# Patient Record
Sex: Female | Born: 1995 | Race: Black or African American | Hispanic: No | Marital: Single | Smoking: Former smoker
Health system: Southern US, Community
[De-identification: ages and names within clinical notes are randomized; demographics above are authoritative.]

## PROBLEM LIST (undated history)

## (undated) DIAGNOSIS — J45909 Unspecified asthma, uncomplicated: Secondary | ICD-10-CM

## (undated) DIAGNOSIS — L309 Dermatitis, unspecified: Secondary | ICD-10-CM

## (undated) HISTORY — PX: NO PAST SURGERIES: SHX2092

---
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
## 2016-09-06 ENCOUNTER — Encounter (HOSPITAL_COMMUNITY): Payer: Self-pay

## 2016-09-06 ENCOUNTER — Emergency Department (HOSPITAL_COMMUNITY)
Admission: EM | Admit: 2016-09-06 | Discharge: 2016-09-06 | Disposition: A | Discharge status: Home / Self Care | Payer: Medicaid Other | Attending: Emergency Medicine | Admitting: Emergency Medicine

## 2016-09-06 DIAGNOSIS — R300 Dysuria: Secondary | ICD-10-CM | POA: Insufficient documentation

## 2016-09-06 DIAGNOSIS — Z5321 Procedure and treatment not carried out due to patient leaving prior to being seen by health care provider: Secondary | ICD-10-CM | POA: Insufficient documentation

## 2016-09-06 NOTE — ED Notes (Signed)
Called Pt for urine sample in lobby, no response.
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
## 2016-10-13 ENCOUNTER — Ambulatory Visit (HOSPITAL_COMMUNITY)
Admission: EM | Admit: 2016-10-13 | Discharge: 2016-10-13 | Disposition: A | Discharge status: Home / Self Care | Payer: Medicaid Other | Attending: Family Medicine | Admitting: Family Medicine

## 2016-10-13 ENCOUNTER — Encounter (HOSPITAL_COMMUNITY): Payer: Self-pay | Admitting: Family Medicine

## 2016-10-13 DIAGNOSIS — N3000 Acute cystitis without hematuria: Secondary | ICD-10-CM | POA: Diagnosis not present

## 2016-10-13 DIAGNOSIS — R300 Dysuria: Secondary | ICD-10-CM | POA: Insufficient documentation

## 2016-10-13 DIAGNOSIS — Z113 Encounter for screening for infections with a predominantly sexual mode of transmission: Secondary | ICD-10-CM

## 2016-10-13 DIAGNOSIS — J45909 Unspecified asthma, uncomplicated: Secondary | ICD-10-CM

## 2016-10-13 DIAGNOSIS — Z3202 Encounter for pregnancy test, result negative: Secondary | ICD-10-CM | POA: Insufficient documentation

## 2016-10-13 LAB — POCT URINALYSIS DIP (DEVICE)
Bilirubin Urine: NEGATIVE
Glucose, UA: NEGATIVE mg/dL
HGB URINE DIPSTICK: NEGATIVE
KETONES UR: NEGATIVE mg/dL
Nitrite: POSITIVE — AB
PROTEIN: NEGATIVE mg/dL
SPECIFIC GRAVITY, URINE: 1.025 (ref 1.005–1.030)
Urobilinogen, UA: 1 mg/dL (ref 0.0–1.0)
pH: 7 (ref 5.0–8.0)

## 2016-10-13 LAB — POCT PREGNANCY, URINE: PREG TEST UR: NEGATIVE

## 2016-10-13 MED ORDER — MONTELUKAST SODIUM 10 MG PO TABS: 10.0000 mg | ORAL_TABLET | Freq: Every day | ORAL | 2 refills | Status: DC

## 2016-10-13 MED ORDER — PHENAZOPYRIDINE HCL 200 MG PO TABS: 200.0000 mg | ORAL_TABLET | Freq: Three times a day (TID) | ORAL | 0 refills | Status: DC | PRN

## 2016-10-13 MED ORDER — ALBUTEROL SULFATE HFA 108 (90 BASE) MCG/ACT IN AERS: 1.0000 | INHALATION_SPRAY | Freq: Four times a day (QID) | RESPIRATORY_TRACT | 0 refills | Status: DC | PRN

## 2016-10-13 MED ORDER — SULFAMETHOXAZOLE-TRIMETHOPRIM 800-160 MG PO TABS: 1.0000 | ORAL_TABLET | Freq: Two times a day (BID) | ORAL | 0 refills | Status: AC

## 2016-10-13 NOTE — ED Triage Notes (Signed)
Pt here for foul smelling urine and also sts that her breathing has been bad. sts asthma and her allergies are acting up.

## 2016-10-13 NOTE — ED Provider Notes (Signed)
CSN: [id number]     Arrival date & time 10/13/16  1848 History   First MD Initiated Contact with Patient 10/13/16 1945     Chief Complaint  Patient presents with  . Asthma  . Dysuria   (Consider location/radiation/quality/duration/timing/severity/associated sxs/prior Treatment) 21 year old presents to clinic with dysuria, cough, wheezing, and rhinorrhea. Has past history of seasonal allergies    Asthma  This is a recurrent problem. The current episode started more than 2 days ago. The problem occurs daily. The problem has been gradually worsening. Associated symptoms include shortness of breath. Pertinent negatives include no chest pain, no abdominal pain and no headaches. The symptoms are aggravated by walking and exertion. The symptoms are relieved by medications. The treatment provided mild relief.  Dysuria  Pain quality:  Aching and burning Pain severity:  Moderate Onset quality:  Gradual Duration:  1 day Timing:  Constant Progression:  Worsening Chronicity:  New Recent urinary tract infections: no   Relieved by:  None tried Ineffective treatments:  None tried Urinary symptoms: discolored urine, foul-smelling urine and frequent urination   Urinary symptoms: no hematuria, no hesitancy and no bladder incontinence   Associated symptoms: no abdominal pain, no nausea, no vaginal discharge and no vomiting   Risk factors: not pregnant     Past Medical History:  Diagnosis Date  . Asthma    History reviewed. No pertinent surgical history. History reviewed. No pertinent family history. Social History  Substance Use Topics  . Smoking status: Current Every Day Smoker  . Smokeless tobacco: Never Used  . Alcohol use Yes     Comment: social   OB History    No data available     Review of Systems  Constitutional: Negative.   HENT: Positive for congestion, rhinorrhea and sneezing. Negative for sinus pain and sinus pressure.   Eyes: Positive for itching. Negative for redness.   Respiratory: Positive for cough and shortness of breath. Negative for wheezing.   Cardiovascular: Negative for chest pain and palpitations.  Gastrointestinal: Negative for abdominal pain, diarrhea, nausea and vomiting.  Genitourinary: Positive for dysuria, frequency and urgency. Negative for vaginal bleeding and vaginal discharge.  Musculoskeletal: Negative.   Skin: Negative.   Neurological: Negative for light-headedness and headaches.    Allergies  Penicillins  Home Medications   Prior to Admission medications   Medication Sig Start Date End Date Taking? Authorizing Provider  albuterol (PROVENTIL HFA;VENTOLIN HFA) 108 (90 Base) MCG/ACT inhaler Inhale 1-2 puffs into the lungs every 6 (six) hours as needed for wheezing or shortness of breath. 10/13/16   Dorena Bodo, NP  montelukast (SINGULAIR) 10 MG tablet Take 1 tablet (10 mg total) by mouth at bedtime. 10/13/16   Dorena Bodo, NP  phenazopyridine (PYRIDIUM) 200 MG tablet Take 1 tablet (200 mg total) by mouth 3 (three) times daily as needed for pain. 10/13/16   Dorena Bodo, NP  sulfamethoxazole-trimethoprim (BACTRIM DS,SEPTRA DS) 800-160 MG tablet Take 1 tablet by mouth 2 (two) times daily. 10/13/16 10/20/16  Dorena Bodo, NP   Meds Ordered and Administered this Visit  Medications - No data to display  BP 125/83 (BP Location: Right Arm)   Pulse 84   Temp 98.1 F (36.7 C) (Oral)   Resp 16   LMP 09/25/2016   SpO2 100%  No data found.   Physical Exam  Constitutional: She is oriented to person, place, and time. She appears well-developed and well-nourished. No distress.  HENT:  Head: Normocephalic and atraumatic.  Right Ear: External ear normal.  Left Ear: External ear normal.  Eyes: Conjunctivae are normal. Right eye exhibits no discharge. Left eye exhibits no discharge.  Neck: Normal range of motion.  Cardiovascular: Normal rate and regular rhythm.   Pulmonary/Chest: Effort normal and breath sounds normal.   Abdominal: Soft. Bowel sounds are normal. She exhibits no distension. There is no tenderness. There is no guarding, no CVA tenderness and no tenderness at McBurney's point.  Neurological: She is alert and oriented to person, place, and time.  Skin: Skin is warm and dry. Capillary refill takes less than 2 seconds. She is not diaphoretic.  Psychiatric: She has a normal mood and affect. Her behavior is normal.  Nursing note and vitals reviewed.   Urgent Care Course     Procedures (including critical care time)  Labs Review Labs Reviewed  POCT URINALYSIS DIP (DEVICE) - Abnormal; Notable for the following:       Result Value   Nitrite POSITIVE (*)    Leukocytes, UA TRACE (*)    All other components within normal limits  URINE CULTURE  POCT PREGNANCY, URINE  URINE CYTOLOGY ANCILLARY ONLY    Imaging Review No results found.      MDM   1. Acute cystitis without hematuria   2. Routine screening for STI (sexually transmitted infection)   3. Asthma due to environmental allergies     1. For acute cystitis, starting Bactrim due to penicillin allergy with hives, given Pyridium, encourage fluids, urine sent for culture.  2. Patient's request screening for gonorrhea, chlamydia, trichomoniasis, BV, and yeast candidiasis. We'll notify if positive in 3-5 business days.  3. Start over-the-counter Claritin or Zyrtec daily, Singulair nightly, refilled albuterol inhaler. Follow-up with primary care for symptom management.     Dorena Bodo, NP 10/13/16 2032

## 2016-10-13 NOTE — Discharge Instructions (Signed)
For your seasonal allergies, take a Claritin or Zyrtec every day for the rest of the season. Started on Singulair, take one tablet every night at bedtime. And I have refilled her albuterol inhaler, do 1-2 puffs every 4-6 hours as needed.  For UTI, started on Bactrim, take one tablet twice a day for 5 days. Started on Pyridium, take one tablet every 3 hours as needed, this will dye your urine red or orange this is a normal side effect.  Finally, we've sent you urine for testing for STDs and STIs, we will notify if anything is positive in 3-5 business days and given instructions as to follow-up.

## 2016-10-15 LAB — URINE CULTURE: Culture: >=100000 — AB

## 2016-10-22 ENCOUNTER — Telehealth (HOSPITAL_COMMUNITY): Payer: Self-pay | Admitting: Internal Medicine

## 2016-10-22 LAB — URINE CYTOLOGY ANCILLARY ONLY
Chlamydia: POSITIVE — AB
Neisseria Gonorrhea: NEGATIVE
Trichomonas: NEGATIVE

## 2016-10-22 MED ORDER — AZITHROMYCIN 500 MG PO TABS: 1000.0000 mg | ORAL_TABLET | Freq: Once | ORAL | 0 refills | Status: AC

## 2016-10-22 NOTE — Telephone Encounter (Signed)
Clinical staff, please let patient know that test for chlamydia was positive.  Rx zithromax sent to pharmacy of record, [facility] [address].  Sexual partners need to be notified and tested/treated.  Condoms may reduce risk of reinfection.  Recheck for further evaluation if symptoms are not improving.  LM

## 2016-10-26 LAB — URINE CYTOLOGY ANCILLARY ONLY: Candida vaginitis: NEGATIVE

## 2016-12-09 ENCOUNTER — Ambulatory Visit (HOSPITAL_COMMUNITY)
Admission: EM | Admit: 2016-12-09 | Discharge: 2016-12-09 | Disposition: A | Discharge status: Home / Self Care | Payer: Medicaid Other | Attending: Family Medicine | Admitting: Family Medicine

## 2016-12-09 ENCOUNTER — Encounter (HOSPITAL_COMMUNITY): Payer: Self-pay | Admitting: Emergency Medicine

## 2016-12-09 DIAGNOSIS — Z880 Allergy status to penicillin: Secondary | ICD-10-CM | POA: Insufficient documentation

## 2016-12-09 DIAGNOSIS — Z3202 Encounter for pregnancy test, result negative: Secondary | ICD-10-CM

## 2016-12-09 DIAGNOSIS — N898 Other specified noninflammatory disorders of vagina: Secondary | ICD-10-CM | POA: Insufficient documentation

## 2016-12-09 DIAGNOSIS — Z113 Encounter for screening for infections with a predominantly sexual mode of transmission: Secondary | ICD-10-CM | POA: Insufficient documentation

## 2016-12-09 LAB — POCT PREGNANCY, URINE: Preg Test, Ur: NEGATIVE

## 2016-12-09 MED ORDER — METRONIDAZOLE 500 MG PO TABS: 500.0000 mg | ORAL_TABLET | Freq: Two times a day (BID) | ORAL | 0 refills | Status: DC

## 2016-12-09 NOTE — ED Triage Notes (Signed)
[facility] a [facility] and std checks.

## 2016-12-09 NOTE — ED Provider Notes (Signed)
  [facility]   [id number] 12/09/16 Arrival Time: 1328  ASSESSMENT & PLAN:  1. Vaginal discharge     Meds ordered this encounter  Medications  . metroNIDAZOLE (FLAGYL) 500 MG tablet    Sig: Take 1 tablet (500 mg total) by mouth 2 (two) times daily.    Dispense:  14 tablet    Refill:  0   Will treat empirically for BV. Tested positive at last visit but no treatment since she was without symptoms. Cytology pending for GC/Chlamydia/BV/Candida.  Reviewed expectations re: course of current medical issues. Questions answered. Outlined signs and symptoms indicating need for more acute intervention. Follow up here or in the Emergency Department if worsening. Patient verbalized understanding. After Visit Summary given.   SUBJECTIVE:  Amber Lyons is a 21 y.o. female who presents with complaint of vaginal discharge for one day. No specific pelvic or abdominal pain reported. Some lower back discomfort described as a "sore feeling". On/off. Dull in nature. No injury. Vaginal discharge is white. Questions odor. No skin irritation or erythema described. No rash or lesions. One female sexual partner. Last intercourse 1 week ago. Patient's last menstrual period was 11/19/2016 (exact date). No vaginal bleeding. No urinary symptoms. Normal PO intake. No n/v. No self-treatment.  Desires STD check.  ROS: As per HPI.  OBJECTIVE:  Vitals:   12/09/16 1335  BP: 133/77  Pulse: 75  Resp: 18  Temp: 98 F (36.7 C)  TempSrc: Oral  SpO2: 96%     General appearance: alert, cooperative, appears stated age and no distress Back: no CVA tenderness Abdomen: soft, non-tender; bowel sounds normal; no masses or organomegaly; no guarding or rebound tenderness GU: no external vaginal abnormalities; cervix normal; white discharge present; no bleeding Extremities: extremities normal, atraumatic, no cyanosis or edema Skin: warm and dry; no rashes or lesions   Results for orders placed or performed  during the hospital encounter of 12/09/16  Pregnancy, urine POC  Result Value Ref Range   Preg Test, Ur NEGATIVE NEGATIVE    Labs Reviewed  POCT PREGNANCY, URINE  CERVICOVAGINAL ANCILLARY ONLY    No results found.  Allergies  Allergen Reactions  . Penicillins Swelling    PMHx, SurgHx, SocialHx, Medications, and Allergies were reviewed in the Visit Navigator and updated as appropriate.       Mardella LaymanHagler, Jakiya Bookbinder, MD 12/09/16 1606

## 2016-12-10 LAB — CERVICOVAGINAL ANCILLARY ONLY
Bacterial vaginitis: NEGATIVE
Candida vaginitis: POSITIVE — AB
Chlamydia: NEGATIVE
Neisseria Gonorrhea: NEGATIVE
Trichomonas: POSITIVE — AB

## 2016-12-11 ENCOUNTER — Telehealth: Payer: Self-pay | Admitting: Internal Medicine

## 2016-12-11 MED ORDER — FLUCONAZOLE 150 MG PO TABS: 150.0000 mg | ORAL_TABLET | Freq: Once | ORAL | 0 refills | Status: AC

## 2016-12-11 NOTE — Telephone Encounter (Signed)
Clinical staff, please let patient know that test for candida (yeast) was positive.  Rx fluconazole sent to the [facility] [address] [facility] [address].   Test for trichomonas was also positive; rx metronidazole was given at the urgent care visit 6/27. Refrain from sexual intercourse for the next 7d.  Sexual partners need to be notified and tested/treated.  Condoms may reduce risk of reinfection. Recheck for further evaluation if symptoms are not improving.  LM

## 2017-01-01 ENCOUNTER — Ambulatory Visit (HOSPITAL_COMMUNITY)
Admission: EM | Admit: 2017-01-01 | Discharge: 2017-01-01 | Disposition: A | Discharge status: Home / Self Care | Payer: Medicaid Other | Attending: Internal Medicine | Admitting: Internal Medicine

## 2017-01-01 ENCOUNTER — Encounter (HOSPITAL_COMMUNITY): Payer: Self-pay | Admitting: *Deleted

## 2017-01-01 DIAGNOSIS — R0981 Nasal congestion: Secondary | ICD-10-CM | POA: Diagnosis not present

## 2017-01-01 MED ORDER — CETIRIZINE-PSEUDOEPHEDRINE ER 5-120 MG PO TB12: 1.0000 | ORAL_TABLET | Freq: Every day | ORAL | 0 refills | Status: DC

## 2017-01-01 NOTE — Discharge Instructions (Signed)
Your symptoms could be caused by seasonal allergies. Start Zyrtec-D for nasal congestion. Use your albuterol if you have further wheezing episodes. Follow up with PCP for further workup.

## 2017-01-01 NOTE — ED Provider Notes (Signed)
CSN: [id number]     Arrival date & time 01/01/17  1224 History   None    Chief Complaint  Patient presents with  . Wheezing   (Consider location/radiation/quality/duration/timing/severity/associated sxs/prior Treatment) 21 year old female comes in with two-day history of wheezing, chest pressure. She states she woke up this morning, with having a lot of trouble breathing, and chest tenderness. She has a prescription for albuterol, but has not tried it. Wheezing has gone away. She's also had some nasal congestion. Denies shortness of breath. Denies fever, chills, night sweats. Denies ear pain, eye pain, abdominal pain, nausea, vomiting, diarrhea.      Past Medical History:  Diagnosis Date  . Asthma    History reviewed. No pertinent surgical history. History reviewed. No pertinent family history. Social History  Substance Use Topics  . Smoking status: Current Every Day Smoker  . Smokeless tobacco: Never Used  . Alcohol use Yes     Comment: social   OB History    No data available     Review of Systems  Reason unable to perform ROS: History of present illness as above.    Allergies  Penicillins  Home Medications   Prior to Admission medications   Medication Sig Start Date End Date Taking? Authorizing Provider  cetirizine-pseudoephedrine (ZYRTEC-D) 5-120 MG tablet Take 1 tablet by mouth daily. 01/01/17   Cathie HoopsYu, Caitlyne Ingham V, PA-C  metroNIDAZOLE (FLAGYL) 500 MG tablet Take 1 tablet (500 mg total) by mouth 2 (two) times daily. 12/09/16   Mardella LaymanHagler, Brian, MD   Meds Ordered and Administered this Visit  Medications - No data to display  BP 130/72 (BP Location: Right Arm)   Pulse 70   Temp 98.6 F (37 C) (Oral)   Resp 18   LMP 12/17/2016   SpO2 100%  No data found.   Physical Exam  Constitutional: She is oriented to person, place, and time. She appears well-developed and well-nourished. No distress.  HENT:  Head: Normocephalic and atraumatic.  Right Ear: Tympanic membrane,  external ear and ear canal normal. Tympanic membrane is not erythematous and not bulging.  Left Ear: Tympanic membrane, external ear and ear canal normal. Tympanic membrane is not erythematous and not bulging.  Nose: Rhinorrhea present. Right sinus exhibits no maxillary sinus tenderness and no frontal sinus tenderness. Left sinus exhibits no maxillary sinus tenderness and no frontal sinus tenderness.  Mouth/Throat: Uvula is midline, oropharynx is clear and moist and mucous membranes are normal. Tonsils are 1+ on the right. Tonsils are 1+ on the left.  Eyes: Pupils are equal, round, and reactive to light. Conjunctivae are normal.  Neck: Normal range of motion. Neck supple.  Cardiovascular: Normal rate, regular rhythm and normal heart sounds.  Exam reveals no gallop and no friction rub.   No murmur heard. Pulmonary/Chest: Effort normal and breath sounds normal. She has no decreased breath sounds. She has no rhonchi. She has no rales.  Patient with wheezing when breathing from her mouth, no wheezing heard with breathing through her nose.  Lymphadenopathy:    She has no cervical adenopathy.  Neurological: She is alert and oriented to person, place, and time.  Skin: Skin is warm and dry.  Psychiatric: She has a normal mood and affect. Her behavior is normal. Judgment normal.    Urgent Care Course     Procedures (including critical care time)  Labs Review Labs Reviewed - No data to display  Imaging Review No results found.      MDM   1.  Nasal congestion    Discussed with patient symptoms she is experiencing could be due to nasal congestion and postnasal drip. Patient to start Zyrtec-D for nasal congestion. She can use her inhaler as needed for wheezing. Patient with wheezing when she breathes through her mouth, but no wheezing heard when she is breathing nasally. Given O2 sat at 100% at room air, low suspicion for asthma exacerbation at this visit. Discussed with patient importance of  establishing with PCP. Resources given to patient.    Belinda Fisher, PA-C 01/01/17 1323

## 2017-01-01 NOTE — ED Triage Notes (Signed)
Pt  Reports   sev  Days  Of  Tightness  In  Chest          Wheezing   And  Chest  Pain  Which  Is  Worse   When  She  Coughs     Pt     Reports  She  Usually     Has  An inhaler   But   Did  Not  Use     During this  Episode        At this  Time  Pt is   Siting  Upright  Speaking in  Complete  sentances

## 2017-01-05 ENCOUNTER — Encounter (HOSPITAL_COMMUNITY): Payer: Self-pay | Admitting: *Deleted

## 2017-01-05 ENCOUNTER — Emergency Department (HOSPITAL_COMMUNITY): Payer: Self-pay

## 2017-01-05 ENCOUNTER — Emergency Department (HOSPITAL_COMMUNITY)
Admission: EM | Admit: 2017-01-05 | Discharge: 2017-01-05 | Disposition: A | Discharge status: Home / Self Care | Payer: Self-pay | Attending: Emergency Medicine | Admitting: Emergency Medicine

## 2017-01-05 DIAGNOSIS — F17290 Nicotine dependence, other tobacco product, uncomplicated: Secondary | ICD-10-CM | POA: Insufficient documentation

## 2017-01-05 DIAGNOSIS — R0789 Other chest pain: Secondary | ICD-10-CM

## 2017-01-05 DIAGNOSIS — J9801 Acute bronchospasm: Secondary | ICD-10-CM | POA: Insufficient documentation

## 2017-01-05 DIAGNOSIS — Z79899 Other long term (current) drug therapy: Secondary | ICD-10-CM | POA: Insufficient documentation

## 2017-01-05 MED ORDER — PREDNISONE 20 MG PO TABS
60.0000 mg | ORAL_TABLET | Freq: Once | ORAL | Status: AC
  Administered 2017-01-05: 60 mg via ORAL
  Filled 2017-01-05: qty 3

## 2017-01-05 MED ORDER — PREDNISONE 20 MG PO TABS: 60.0000 mg | ORAL_TABLET | Freq: Every day | ORAL | 0 refills | Status: DC

## 2017-01-05 MED ORDER — IPRATROPIUM BROMIDE 0.02 % IN SOLN
0.5000 mg | Freq: Once | RESPIRATORY_TRACT | Status: AC
  Administered 2017-01-05: 0.5 mg via RESPIRATORY_TRACT
  Filled 2017-01-05: qty 2.5

## 2017-01-05 MED ORDER — ALBUTEROL SULFATE (2.5 MG/3ML) 0.083% IN NEBU
5.0000 mg | INHALATION_SOLUTION | Freq: Once | RESPIRATORY_TRACT | Status: AC
  Administered 2017-01-05: 5 mg via RESPIRATORY_TRACT
  Filled 2017-01-05: qty 6

## 2017-01-05 NOTE — ED Provider Notes (Signed)
[facility]-EMERGENCY DEPT Provider Note   CSN: [id number] Arrival date & time: 01/05/17  1120     History   Chief Complaint Chief Complaint  Patient presents with  . Chest Pain    HPI Amber Lyons is a 21 y.o. female.  Pt with hx of asthma who presents with persistent right side chest pain.  Pt seen at UC x 2 days ago where thought likely related to mild asthma exacerbation.  Pt was given albuterol mdi and zyrtec.  Some short lived relief with albuterol.  No fevers,no n/v/d, no sore throat, no rash.     The history is provided by the patient. No language interpreter was used.  Chest Pain   This is a new problem. The current episode started more than 2 days ago. The problem occurs constantly. The problem has not changed since onset.The pain is associated with movement and breathing. The pain is present in the lateral region. The pain is at a severity of 5/10. The pain is mild. The quality of the pain is described as pressure-like and stabbing. The pain does not radiate. Associated symptoms include shortness of breath. Pertinent negatives include no abdominal pain, no back pain, no claudication, no cough, no fever, no headaches, no irregular heartbeat, no leg pain, no syncope and no vomiting. Treatments tried: albuterol. The treatment provided mild relief.    Past Medical History:  Diagnosis Date  . Asthma     There are no active problems to display for this patient.   History reviewed. No pertinent surgical history.  OB History    No data available       Home Medications    Prior to Admission medications   Medication Sig Start Date End Date Taking? Authorizing Provider  cetirizine-pseudoephedrine (ZYRTEC-D) 5-120 MG tablet Take 1 tablet by mouth daily. 01/01/17   Cathie Hoops, Amy V, PA-C  metroNIDAZOLE (FLAGYL) 500 MG tablet Take 1 tablet (500 mg total) by mouth 2 (two) times daily. 12/09/16   Mardella Layman, MD  predniSONE (DELTASONE) 20 MG tablet Take 3 tablets (60 mg total) by mouth  daily. 01/05/17   Niel Hummer, MD    Family History No family history on file.  Social History Social History  Substance Use Topics  . Smoking status: Current Every Day Smoker    Packs/day: 2.00    Types: Cigars  . Smokeless tobacco: Never Used  . Alcohol use Yes     Comment: social     Allergies   Penicillins   Review of Systems Review of Systems  Constitutional: Negative for fever.  Respiratory: Positive for shortness of breath. Negative for cough.   Cardiovascular: Positive for chest pain. Negative for claudication and syncope.  Gastrointestinal: Negative for abdominal pain and vomiting.  Musculoskeletal: Negative for back pain.  Neurological: Negative for headaches.  All other systems reviewed and are negative.    Physical Exam Updated Vital Signs BP 130/80   Pulse 80   Temp 98 F (36.7 C) (Temporal)   Resp 16   Ht 5\' 6"  (1.676 m)   Wt 87.5 kg (193 lb)   LMP 12/13/2016 (Approximate)   SpO2 100%   BMI 31.15 kg/m   Physical Exam  Constitutional: She is oriented to person, place, and time. She appears well-developed and well-nourished.  HENT:  Head: Normocephalic and atraumatic.  Right Ear: External ear normal.  Left Ear: External ear normal.  Mouth/Throat: Oropharynx is clear and moist.  Eyes: Conjunctivae and EOM are normal.  Neck: Normal range  of motion. Neck supple.  Cardiovascular: Normal rate, normal heart sounds and intact distal pulses.   Pulmonary/Chest: Effort normal. She has wheezes. She exhibits no tenderness.  Diffuse expiratory wheeze noted in all lung fields, no retractions.  Prolonged expirations.   Abdominal: Soft. Bowel sounds are normal. There is no tenderness. There is no rebound.  Musculoskeletal: Normal range of motion.  Neurological: She is alert and oriented to person, place, and time.  Skin: Skin is warm.  Nursing note and vitals reviewed.    ED Treatments / Results  Labs (all labs ordered are listed, but only abnormal  results are displayed) Labs Reviewed - No data to display  EKG  EKG Interpretation  Date/Time:  Tuesday January 05 2017 11:27:33 EDT Ventricular Rate:  67 PR Interval:  124 QRS Duration: 82 QT Interval:  378 QTC Calculation: 399 R Axis:   -5 Text Interpretation:  Normal sinus rhythm Normal ECG no stemi, normal qtc, no delta. Confirmed by Tonette LedererKuhner MD, Tenny Crawoss [phone]) on 01/05/2017 11:53:50 AM       Radiology Dg Chest 2 View  Result Date: 01/05/2017 CLINICAL DATA:  Two-day history of mid and right-sided chest pain, shortness of breath and productive cough. Current history of asthma. EXAM: CHEST  2 VIEW COMPARISON:  None. FINDINGS: Cardiomediastinal silhouette unremarkable. Lungs clear. Bronchovascular markings normal. Pulmonary vascularity normal. No pneumothorax. No pleural effusions. Visualized bony thorax intact. IMPRESSION: Normal examination. Electronically Signed   By: Hulan Saashomas  Lawrence M.D.   On: 01/05/2017 11:48    Procedures Procedures (including critical care time)  Medications Ordered in ED Medications  albuterol (PROVENTIL) (2.5 MG/3ML) 0.083% nebulizer solution 5 mg (5 mg Nebulization Given 01/05/17 1218)  ipratropium (ATROVENT) nebulizer solution 0.5 mg (0.5 mg Nebulization Given 01/05/17 1218)  predniSONE (DELTASONE) tablet 60 mg (60 mg Oral Given 01/05/17 1216)  albuterol (PROVENTIL) (2.5 MG/3ML) 0.083% nebulizer solution 5 mg (5 mg Nebulization Given 01/05/17 1316)  ipratropium (ATROVENT) nebulizer solution 0.5 mg (0.5 mg Nebulization Given 01/05/17 1316)     Initial Impression / Assessment and Plan / ED Course  I have reviewed the triage vital signs and the nursing notes.  Pertinent labs & imaging results that were available during my care of the patient were reviewed by me and considered in my medical decision making (see chart for details).     [age] y with persistent righted chest pain, pt with wheezing on exam.  Seems to be related to bronchospasm. Will give albuterol  and atrovent and steroids.  Will obtain cxr to ensure not related to ptx or pneumonia.  Will obtain ekg to eval for any arrhthymias. Will obtain  Troponin and bmp and cbc.  Highly doubt any PE given normal heart rate, normal O2 sats and wheezing on exam.  cxr visualized by me and no ptx, no pneumonia.    ekg with normal sinus, no stemi, normal qtc.  After 1 neb of albuterol and atrovent and steroids,  child with significant improvement.  Faint end expiratory wheeze and no retractions.  Will repeat albuterol and atrovent and re-eval.    After 2 nebs of albuterol and atrovent and steroids,  child with no wheeze and no retractions.  No longer with chest pain.  Will dc home with 4 more days of steroids.  Pt does have enough albuterol at home.  Discussed signs that warrant reevaluation. Will have follow up with pcp in 2-3 days if not improved.   Final Clinical Impressions(s) / ED Diagnoses   Final diagnoses:  Bronchospasm  Chest wall pain    New Prescriptions Discharge Medication List as of 01/05/2017  1:42 PM    START taking these medications   Details  predniSONE (DELTASONE) 20 MG tablet Take 3 tablets (60 mg total) by mouth daily., Starting Tue 01/05/2017, Print         Niel Hummer, MD 01/05/17 [phone]

## 2017-01-05 NOTE — ED Notes (Signed)
Patient transported to X-ray 

## 2017-01-05 NOTE — ED Triage Notes (Signed)
Pt in from productive with clear sputum, pt seen at UC x 2 days ago, pt reports mid & R CP with SOB, pt denies n/v/d, pt A&O x 4

## 2017-05-16 ENCOUNTER — Inpatient Hospital Stay (HOSPITAL_COMMUNITY): Payer: Self-pay

## 2017-05-16 ENCOUNTER — Inpatient Hospital Stay (HOSPITAL_COMMUNITY)
Admission: AD | Admit: 2017-05-16 | Discharge: 2017-05-16 | Disposition: A | Discharge status: Home / Self Care | Payer: Self-pay | Source: Ambulatory Visit | Attending: Emergency Medicine | Admitting: Emergency Medicine

## 2017-05-16 ENCOUNTER — Other Ambulatory Visit: Payer: Self-pay

## 2017-05-16 ENCOUNTER — Encounter (HOSPITAL_COMMUNITY): Payer: Self-pay

## 2017-05-16 DIAGNOSIS — R0789 Other chest pain: Secondary | ICD-10-CM | POA: Insufficient documentation

## 2017-05-16 DIAGNOSIS — J45909 Unspecified asthma, uncomplicated: Secondary | ICD-10-CM | POA: Insufficient documentation

## 2017-05-16 DIAGNOSIS — Z87891 Personal history of nicotine dependence: Secondary | ICD-10-CM | POA: Insufficient documentation

## 2017-05-16 DIAGNOSIS — M5410 Radiculopathy, site unspecified: Secondary | ICD-10-CM | POA: Insufficient documentation

## 2017-05-16 DIAGNOSIS — M79601 Pain in right arm: Secondary | ICD-10-CM | POA: Insufficient documentation

## 2017-05-16 DIAGNOSIS — R079 Chest pain, unspecified: Secondary | ICD-10-CM

## 2017-05-16 LAB — CBC
HCT: 35.4 % — ABNORMAL LOW (ref 36.0–46.0)
HEMOGLOBIN: 11.4 g/dL — AB (ref 12.0–15.0)
MCH: 29.6 pg (ref 26.0–34.0)
MCHC: 32.2 g/dL (ref 30.0–36.0)
MCV: 91.9 fL (ref 78.0–100.0)
Platelets: 281 10*3/uL (ref 150–400)
RBC: 3.85 MIL/uL — ABNORMAL LOW (ref 3.87–5.11)
RDW: 13.2 % (ref 11.5–15.5)
WBC: 6.2 10*3/uL (ref 4.0–10.5)

## 2017-05-16 LAB — HEPATIC FUNCTION PANEL
ALBUMIN: 3.7 g/dL (ref 3.5–5.0)
ALK PHOS: 69 U/L (ref 38–126)
ALT: 8 U/L — AB (ref 14–54)
AST: 16 U/L (ref 15–41)
BILIRUBIN TOTAL: 0.6 mg/dL (ref 0.3–1.2)
Bilirubin, Direct: 0.2 mg/dL (ref 0.1–0.5)
Indirect Bilirubin: 0.4 mg/dL (ref 0.3–0.9)
TOTAL PROTEIN: 7 g/dL (ref 6.5–8.1)

## 2017-05-16 LAB — TROPONIN I
Troponin I: <0.03 ng/mL (ref <0.03)
Troponin I: <0.03 ng/mL (ref <0.03)

## 2017-05-16 LAB — BRAIN NATRIURETIC PEPTIDE: B Natriuretic Peptide: 5.7 pg/mL (ref 0.0–100.0)

## 2017-05-16 LAB — LIPASE, BLOOD: Lipase: 38 U/L (ref 11–51)

## 2017-05-16 LAB — POCT PREGNANCY, URINE: Preg Test, Ur: NEGATIVE

## 2017-05-16 MED ORDER — SODIUM CHLORIDE 0.9% FLUSH: 3.0000 mL | INTRAVENOUS | Status: DC | PRN

## 2017-05-16 MED ORDER — SODIUM CHLORIDE 0.9 % IV SOLN: 250.0000 mL | INTRAVENOUS | Status: DC | PRN

## 2017-05-16 MED ORDER — KETOROLAC TROMETHAMINE 15 MG/ML IJ SOLN
15.0000 mg | Freq: Once | INTRAMUSCULAR | Status: AC
  Administered 2017-05-16: 15 mg via INTRAVENOUS
  Filled 2017-05-16: qty 1

## 2017-05-16 MED ORDER — GI COCKTAIL ~~LOC~~
30.0000 mL | Freq: Once | ORAL | Status: AC
  Administered 2017-05-16: 30 mL via ORAL
  Filled 2017-05-16: qty 30

## 2017-05-16 MED ORDER — RANITIDINE HCL 150 MG PO TABS
150.0000 mg | ORAL_TABLET | Freq: Two times a day (BID) | ORAL | 0 refills | Status: DC
Start: 2017-05-16 — End: 2019-04-06

## 2017-05-16 MED ORDER — SODIUM CHLORIDE 0.9% FLUSH: 3.0000 mL | Freq: Two times a day (BID) | INTRAVENOUS | Status: DC

## 2017-05-16 MED ORDER — METHOCARBAMOL 500 MG PO TABS: 500.0000 mg | ORAL_TABLET | Freq: Three times a day (TID) | ORAL | 0 refills | Status: DC | PRN

## 2017-05-16 NOTE — ED Notes (Signed)
Patient [facility] with [facility] , pt. articulated intermittent right chest " pressure" 5/10 scale , denies SOB , no nausea or diaphoresis .

## 2017-05-16 NOTE — ED Notes (Addendum)
kmat phleb II collected labs.

## 2017-05-16 NOTE — MAU Provider Note (Signed)
Chief Complaint:  Chest Pain   First Provider Initiated Contact with Patient 05/16/17 0254     HPI: Amber Lyons is a 21 y.o. [id number] who presents to maternity admissions reporting Chest pain and "fast heartbeat" which woke her from sleep.  States has sharp pains going down right arm.  Feels like a weight is on her chest.  No difficulty breathing. No wheezing. Does have history of asthma, which prompted a similar ED visit in July. .States they told her "it's impossible for me to have a heart attack because I'm too young". She reports no vaginal bleeding, vaginal itching/burning, urinary symptoms, h/a, dizziness, n/v, or fever/chills.    After evaluation,, patient does say that "when I go to the ER I like to be screened for everything, like STDs and stuff".  Discussed we need to evaluate the chest pain emergently and [facility]. Is not particularly concerned she was exposed to anything, "but I just like to get screened every time".  States her father "just dropped me off" and did not really come here with any GYN complaints.  Is here because of the chest pain.   Chest Pain   This is a new problem. The current episode started yesterday. The onset quality is gradual. The problem occurs constantly. The problem has been unchanged. The pain is present in the substernal region. The pain is moderate. The quality of the pain is described as heavy, pressure and sharp. The pain radiates to the right shoulder. Associated symptoms include palpitations. Pertinent negatives include no abdominal pain, back pain, cough, diaphoresis, dizziness, fever, headaches, irregular heartbeat (but "heart beats hard and fast"), lower extremity edema, malaise/fatigue, nausea, numbness, shortness of breath, sputum production, syncope or vomiting. The pain is aggravated by nothing. She has tried nothing for the symptoms. There are no known risk factors. Past medical history comments: Asthma    Past  Medical History: Past Medical History:  Diagnosis Date  . Asthma     Past obstetric history: OB History  Gravida Para Term Preterm AB Living  0 0 0 0 0 0  SAB TAB Ectopic Multiple Live Births  0 0 0 0 0        Past Surgical History: Past Surgical History:  Procedure Laterality Date  . NO PAST SURGERIES      Family History: History reviewed. No pertinent family history.  Social History: Social History   Tobacco Use  . Smoking status: Former Smoker    Packs/day: 2.00    Types: Cigars  . Smokeless tobacco: Never Used  Substance Use Topics  . Alcohol use: Yes    Comment: social  . Drug use: Yes    Types: Marijuana    Comment: last use marijuana was 05-15-2017 at 7 pm.    Allergies:  Allergies  Allergen Reactions  . Penicillins Swelling    Meds:  Medications Prior to Admission  Medication Sig Dispense Refill Last Dose  . ibuprofen (ADVIL,MOTRIN) 200 MG tablet Take 200 mg by mouth every 6 (six) hours as needed.   05/16/2017 at Unknown time  . cetirizine-pseudoephedrine (ZYRTEC-D) 5-120 MG tablet Take 1 tablet by mouth daily. 30 tablet 0   . metroNIDAZOLE (FLAGYL) 500 MG tablet Take 1 tablet (500 mg total) by mouth 2 (two) times daily. 14 tablet 0   . predniSONE (DELTASONE) 20 MG tablet Take 3 tablets (60 mg total) by mouth daily. 12 tablet 0     I have reviewed patient's  Past Medical Hx, Surgical Hx, Family Hx, Social Hx, medications and allergies.  ROS:  Review of Systems  Constitutional: Negative for diaphoresis, fever and malaise/fatigue.  Respiratory: Negative for cough, sputum production and shortness of breath.   Cardiovascular: Positive for chest pain and palpitations. Negative for syncope.  Gastrointestinal: Negative for abdominal pain, nausea and vomiting.  Musculoskeletal: Negative for back pain.  Neurological: Negative for dizziness, numbness and headaches.   Other systems negative     Physical Exam   Patient Vitals for the past 24 hrs:  BP  Temp Temp src Pulse Resp SpO2 Height Weight  05/16/17 0236 122/74 97.6 F (36.4 C) Oral 80 18 100 % 5\' 6"  (1.676 m) 189 lb (85.7 kg)   Constitutional: Well-developed, well-nourished female in no acute distress.  Cardiovascular: normal rate and rhythm, no ectopy audible, S1 & S2 heard, no murmur Respiratory: normal effort, no distress. Lungs CTAB with no wheezes or crackles GI: Abd soft, non-tender.  Nondistended.  No rebound, No guarding.   MS: Extremities nontender, no edema, normal ROM Neurologic: Alert and oriented x 4.   Grossly nonfocal. GU: Neg CVAT. Skin:  Warm and Dry Psych:  Affect appropriate.  PELVIC EXAM: Deferred   Labs: Drawn and pending, CBC, BNP, Troponin    Imaging:  EKG  MAU Course/MDM: I have ordered labs as follows: Troponin and bnp and cbc.  Imaging ordered: Chest xray will defer here due to [facility] on the way.   Will get EKG to eval for any arrhthmia  Consult Dr Earlene Plateravis who [facility]. .   .  Dr Erma HeritageIsaacs accepts patient for transfer   Assessment: 1. Chest pain   2.   History of asthma with no current wheezing  Plan: [facility] ED for ongoing evaluation   Wynelle BourgeoisMarie Louvina Cleary CNM, MSN Certified Nurse-Midwife 05/16/2017 2:54 AM

## 2017-05-16 NOTE — ED Notes (Signed)
Labs clicked off in error,  Nurse starting IV and will draw labs.

## 2017-05-16 NOTE — MAU Note (Signed)
My stress level is high. Right arm shooting pains since yesterday morning at 10 am. Then 1pm, felt nauseated. Then felt a weight on my chest through the night.  Felt real hot and heart rate was up and right arm started hurting again.  Took half of ibuprofen 30 min ago, the arm pain got better.  The chest weight is not as heavy now as I relax a little bit. I don't think I'm pregnant but I'm not sure.

## 2017-05-16 NOTE — ED Provider Notes (Signed)
[facility] Provider Note   CSN: [id number] Arrival date & time: 05/16/17  0220     History   Chief Complaint Chief Complaint  Patient presents with  . Chest Pain    HPI Amber Lyons is a 21 y.o. female.  HPI   21 yo F with no significant PMHx here with atypical chest pain. Pt reports that she awoke this evening at midnight with a mild right-sided chest like pressure. She describes it as a tightness sensation in her right upper chest that also radiates toward her shoulder. She's had symptoms similar to this in the past but did not seek help. No SOB, diaphoresis. No nausea or vomiting. Pain mildly worsened with movement. No preceding illness. No LE weakness or swelling. No specific alleviating factors but she has not taken anything for this.  Past Medical History:  Diagnosis Date  . Asthma     There are no active problems to display for this patient.   Past Surgical History:  Procedure Laterality Date  . NO PAST SURGERIES      OB History    Gravida Para Term Preterm AB Living   0 0 0 0 0 0   SAB TAB Ectopic Multiple Live Births   0 0 0 0 0       Home Medications    Prior to Admission medications   Medication Sig Start Date End Date Taking? Authorizing Provider  cetirizine-pseudoephedrine (ZYRTEC-D) 5-120 MG tablet Take 1 tablet by mouth daily. Patient not taking: Reported on 05/16/2017 01/01/17   Belinda FisherYu, Amy V, PA-C  methocarbamol (ROBAXIN) 500 MG tablet Take 1 tablet (500 mg total) by mouth every 8 (eight) hours as needed for muscle spasms. 05/16/17   Shaune PollackIsaacs, Nirvana Blanchett, MD  metroNIDAZOLE (FLAGYL) 500 MG tablet Take 1 tablet (500 mg total) by mouth 2 (two) times daily. Patient not taking: Reported on 05/16/2017 12/09/16   Mardella LaymanHagler, Brian, MD  predniSONE (DELTASONE) 20 MG tablet Take 3 tablets (60 mg total) by mouth daily. Patient not taking: Reported on 05/16/2017 01/05/17   Niel HummerKuhner, Ross, MD  ranitidine (ZANTAC) 150 MG tablet Take 1  tablet (150 mg total) by mouth 2 (two) times daily for 5 days. 05/16/17 05/21/17  Shaune PollackIsaacs, Florie Carico, MD    Family History History reviewed. No pertinent family history.  Social History Social History   Tobacco Use  . Smoking status: Former Smoker    Packs/day: 2.00    Types: Cigars  . Smokeless tobacco: Never Used  Substance Use Topics  . Alcohol use: Yes    Comment: social  . Drug use: Yes    Types: Marijuana    Comment: last use marijuana was 05-15-2017 at 7 pm.     Allergies   Penicillins   Review of Systems Review of Systems  Constitutional: Positive for fatigue. Negative for chills and fever.  HENT: Negative for congestion and rhinorrhea.   Eyes: Negative for visual disturbance.  Respiratory: Negative for cough, shortness of breath and wheezing.   Cardiovascular: Positive for chest pain. Negative for leg swelling.  Gastrointestinal: Negative for abdominal pain, diarrhea, nausea and vomiting.  Genitourinary: Negative for dysuria and flank pain.  Musculoskeletal: Negative for neck pain and neck stiffness.  Skin: Negative for rash and wound.  Allergic/Immunologic: Negative for immunocompromised state.  Neurological: Negative for syncope, weakness and headaches.  All other systems reviewed and are negative.    Physical Exam Updated Vital Signs BP 107/79   Pulse 79   Temp  97.6 F (36.4 C) (Oral)   Resp 17   Ht 5\' 6"  (1.676 m)   Wt 85.7 kg (189 lb)   LMP 04/30/2017   SpO2 100%   BMI 30.51 kg/m   Physical Exam  Constitutional: She is oriented to person, place, and time. She appears well-developed and well-nourished. No distress.  HENT:  Head: Normocephalic and atraumatic.  Eyes: Conjunctivae are normal.  Neck: Neck supple.  Positive Spurling's on right. Mild TTP over right SCM and paraspinal musculature.   Cardiovascular: Normal rate, regular rhythm and normal heart sounds. Exam reveals no friction rub.  No murmur heard. Pulmonary/Chest: Effort normal and  breath sounds normal. No respiratory distress. She has no wheezes. She has no rales.  Abdominal: She exhibits no distension.  Musculoskeletal: She exhibits no edema.  Neurological: She is alert and oriented to person, place, and time. She exhibits normal muscle tone.  Skin: Skin is warm. Capillary refill takes less than 2 seconds.  Psychiatric: She has a normal mood and affect.  Nursing note and vitals reviewed.    ED Treatments / Results  Labs (all labs ordered are listed, but only abnormal results are displayed) Labs Reviewed  CBC - Abnormal; Notable for the following components:      Result Value   RBC 3.85 (*)    Hemoglobin 11.4 (*)    HCT 35.4 (*)    All other components within normal limits  HEPATIC FUNCTION PANEL - Abnormal; Notable for the following components:   ALT 8 (*)    All other components within normal limits  TROPONIN I  BRAIN NATRIURETIC PEPTIDE  LIPASE, BLOOD  TROPONIN I  POCT PREGNANCY, URINE    EKG  EKG Interpretation None       Radiology Dg Chest 2 View  Result Date: 05/16/2017 CLINICAL DATA:  Right-sided chest pain since yesterday. EXAM: CHEST  2 VIEW COMPARISON:  01/05/2017 FINDINGS: The cardiomediastinal contours are normal. The lungs are clear. Pulmonary vasculature is normal. No consolidation, pleural effusion, or pneumothorax. No acute osseous abnormalities are seen. IMPRESSION: Unremarkable radiographs of the chest. Electronically Signed   By: Rubye Oaks M.D.   On: 05/16/2017 05:54    Procedures Procedures (including critical care time)  Medications Ordered in ED Medications  sodium chloride flush (NS) 0.9 % injection 3 mL (3 mLs Intravenous Not Given 05/16/17 0452)  sodium chloride flush (NS) 0.9 % injection 3 mL (not administered)  0.9 %  sodium chloride infusion (not administered)  gi cocktail (Maalox,Lidocaine,Donnatal) (30 mLs Oral Given 05/16/17 0537)  ketorolac (TORADOL) 15 MG/ML injection 15 mg (15 mg Intravenous Given  05/16/17 0537)     Initial Impression / Assessment and Plan / ED Course  I have reviewed the triage vital signs and the nursing notes.  Pertinent labs & imaging results that were available during my care of the patient were reviewed by me and considered in my medical decision making (see chart for details).     21 yo F with no significant PMHx here with atypical chest pain. On exam, pt well appearing and in NAD. Suspect this is multifactorial. Regarding her chest pressure, this is associated with eating, worse lying flat, and resolved with GI cocktail. Suspect GERD/gastritis. She has no RUQ TTP or s/s to suggest biliary colic or cholecystitis. Heart score is <3 and delta trop neg with normal EKG - doubt ACS. PERC neg. Regarding her right upper chest pain, suspect this is more so 2/2 cervical radicular pain. She has positive SPurling's  on right, reproducing her sx. No signs of ongoing cord compromise however. She has normal WOB, normal CXR, and is PERC neg - doubt PE or other pulmonary etiology. Will d/c with supportive care. No apparent acute emergent pathology.  Final Clinical Impressions(s) / ED Diagnoses   Final diagnoses:  Atypical chest pain  Radicular pain    ED Discharge Orders        Ordered    methocarbamol (ROBAXIN) 500 MG tablet  Every 8 hours PRN     05/16/17 0646    ranitidine (ZANTAC) 150 MG tablet  2 times daily     05/16/17 [id number]       Shaune PollackIsaacs, Noela Brothers, MD 05/16/17 [phone]

## 2017-08-04 ENCOUNTER — Encounter (HOSPITAL_COMMUNITY): Payer: Self-pay | Admitting: Emergency Medicine

## 2017-08-04 ENCOUNTER — Other Ambulatory Visit: Payer: Self-pay

## 2017-08-04 ENCOUNTER — Ambulatory Visit (HOSPITAL_COMMUNITY)
Admission: EM | Admit: 2017-08-04 | Discharge: 2017-08-04 | Disposition: A | Discharge status: Home / Self Care | Payer: Self-pay | Attending: Family Medicine | Admitting: Family Medicine

## 2017-08-04 DIAGNOSIS — Z880 Allergy status to penicillin: Secondary | ICD-10-CM | POA: Insufficient documentation

## 2017-08-04 DIAGNOSIS — Z87891 Personal history of nicotine dependence: Secondary | ICD-10-CM | POA: Insufficient documentation

## 2017-08-04 DIAGNOSIS — L309 Dermatitis, unspecified: Secondary | ICD-10-CM

## 2017-08-04 DIAGNOSIS — N3000 Acute cystitis without hematuria: Secondary | ICD-10-CM | POA: Insufficient documentation

## 2017-08-04 DIAGNOSIS — Z113 Encounter for screening for infections with a predominantly sexual mode of transmission: Secondary | ICD-10-CM | POA: Insufficient documentation

## 2017-08-04 LAB — POCT URINALYSIS DIP (DEVICE)
BILIRUBIN URINE: NEGATIVE
Glucose, UA: NEGATIVE mg/dL
Hgb urine dipstick: NEGATIVE
KETONES UR: NEGATIVE mg/dL
Nitrite: NEGATIVE
PH: 6.5 (ref 5.0–8.0)
Protein, ur: NEGATIVE mg/dL
Specific Gravity, Urine: 1.025 (ref 1.005–1.030)
Urobilinogen, UA: 0.2 mg/dL (ref 0.0–1.0)

## 2017-08-04 MED ORDER — AZITHROMYCIN 250 MG PO TABS
ORAL_TABLET | ORAL | Status: AC
  Filled 2017-08-04: qty 4

## 2017-08-04 MED ORDER — SULFAMETHOXAZOLE-TRIMETHOPRIM 800-160 MG PO TABS: 1.0000 | ORAL_TABLET | Freq: Two times a day (BID) | ORAL | 0 refills | Status: AC

## 2017-08-04 MED ORDER — PREDNISONE 10 MG (21) PO TBPK: ORAL_TABLET | Freq: Every day | ORAL | 0 refills | Status: DC

## 2017-08-04 MED ORDER — TRIAMCINOLONE 0.1 % CREAM:EUCERIN CREAM 1:1: 1.0000 "application " | TOPICAL_CREAM | Freq: Two times a day (BID) | CUTANEOUS | 1 refills | Status: DC

## 2017-08-04 MED ORDER — AZITHROMYCIN 250 MG PO TABS
1000.0000 mg | ORAL_TABLET | Freq: Once | ORAL | Status: AC
  Administered 2017-08-04: 1000 mg via ORAL

## 2017-08-04 MED ORDER — CEFTRIAXONE SODIUM 250 MG IJ SOLR
250.0000 mg | Freq: Once | INTRAMUSCULAR | Status: AC
  Administered 2017-08-04: 250 mg via INTRAMUSCULAR

## 2017-08-04 MED ORDER — CEFTRIAXONE SODIUM 250 MG IJ SOLR
INTRAMUSCULAR | Status: AC
  Filled 2017-08-04: qty 250

## 2017-08-04 MED ORDER — LIDOCAINE HCL (PF) 1 % IJ SOLN
INTRAMUSCULAR | Status: AC
Start: 2017-08-04 — End: 2017-08-04
  Filled 2017-08-04: qty 2

## 2017-08-04 NOTE — ED Triage Notes (Signed)
Pt c/o eczema flair up, also states she wants to get tested for STDS, denies std symptoms, was told by someome else to get checked.

## 2017-08-04 NOTE — Discharge Instructions (Addendum)

## 2017-08-05 LAB — URINE CYTOLOGY ANCILLARY ONLY
Chlamydia: POSITIVE — AB
Neisseria Gonorrhea: NEGATIVE
Trichomonas: POSITIVE — AB

## 2017-08-05 LAB — HIV ANTIBODY (ROUTINE TESTING W REFLEX): HIV SCREEN 4TH GENERATION: NONREACTIVE

## 2017-08-05 LAB — RPR: RPR Ser Ql: NONREACTIVE

## 2017-08-05 NOTE — ED Provider Notes (Signed)
[facility]   [id number] 08/04/17 Arrival Time: 1110  ASSESSMENT & PLAN:  1. Eczema, unspecified type   2. Screening for STD (sexually transmitted disease)   3. Acute cystitis without hematuria     Meds ordered this encounter  Medications  . cefTRIAXone (ROCEPHIN) injection 250 mg    Order Specific Question:   Antibiotic Indication:    Answer:   STD  . azithromycin (ZITHROMAX) tablet 1,000 mg  . Triamcinolone Acetonide (TRIAMCINOLONE 0.1 % CREAM : EUCERIN) CREA    Sig: Apply 1 application topically 2 (two) times daily. Don't use on face.    Dispense:  1 each    Refill:  1  . predniSONE (STERAPRED UNI-PAK 21 TAB) 10 MG (21) TBPK tablet    Sig: Take by mouth daily. Take as directed.    Dispense:  21 tablet    Refill:  0  . sulfamethoxazole-trimethoprim (BACTRIM DS,SEPTRA DS) 800-160 MG tablet    Sig: Take 1 tablet by mouth 2 (two) times daily for 5 days.    Dispense:  10 tablet    Refill:  0   Pending: Labs Reviewed  URINE CULTURE  HIV ANTIBODY (ROUTINE TESTING)  RPR  URINE CYTOLOGY ANCILLARY ONLY   Will notify of any positive results. Instructed to refrain from sexual activity for at least seven days.  Reviewed expectations re: course of current medical issues. Questions answered. Outlined signs and symptoms indicating need for more acute intervention. Patient verbalized understanding. After Visit Summary given.   SUBJECTIVE:  Amber Lyons is a 22 y.o. female who presents with complaint of dysuria and mild urinary frequency. Onset gradual, a few days ago. No vaginal discharge though she does want screening for STIs. Sexually active with one female partner. Afebrile. No abdominal or pelvic pain. No n/v. No rashes or lesions. None. Normal PO intake. Ambulatory without difficulty.  Also with history of eczema. Out of steroid cream. Requests Rx. Mild exacerbations this winter with moderate itching.  Patient's last menstrual period was 07/26/2017.  ROS: As  per HPI.  OBJECTIVE:  Vitals:   08/04/17 1153  BP: 140/73  Pulse: 68  Resp: 16  Temp: 98.3 F (36.8 C)  SpO2: 100%     General appearance: alert, cooperative, appears stated age and no distress Throat: lips, mucosa, and tongue normal; teeth and gums normal Back: no CVA tenderness Abdomen: soft, non-tender; bowel sounds normal; no masses or organomegaly; no guarding or rebound tenderness GU: declines Skin: warm and dry; few areas of eczema on extremities Psychological:  Alert and cooperative. Normal mood and affect.  Results for orders placed or performed during the hospital encounter of 08/04/17  POCT urinalysis dip (device)  Result Value Ref Range   Glucose, UA NEGATIVE NEGATIVE mg/dL   Bilirubin Urine NEGATIVE NEGATIVE   Ketones, ur NEGATIVE NEGATIVE mg/dL   Specific Gravity, Urine 1.025 1.005 - 1.030   Hgb urine dipstick NEGATIVE NEGATIVE   pH 6.5 5.0 - 8.0   Protein, ur NEGATIVE NEGATIVE mg/dL   Urobilinogen, UA 0.2 0.0 - 1.0 mg/dL   Nitrite NEGATIVE NEGATIVE   Leukocytes, UA SMALL (A) NEGATIVE     Allergies  Allergen Reactions  . Penicillins Swelling    Past Medical History:  Diagnosis Date  . Asthma     Social History   Socioeconomic History  . Marital status: Married    Spouse name: Not on file  . Number of children: Not on file  . Years of education: Not on file  .  Highest education level: Not on file  Social Needs  . Financial resource strain: Not on file  . Food insecurity - worry: Not on file  . Food insecurity - inability: Not on file  . Transportation needs - medical: Not on file  . Transportation needs - non-medical: Not on file  Occupational History  . Not on file  Tobacco Use  . Smoking status: Former Smoker    Packs/day: 2.00    Types: Cigars  . Smokeless tobacco: Never Used  Substance and Sexual Activity  . Alcohol use: Yes    Comment: social  . Drug use: Yes    Types: Marijuana    Comment: last use marijuana was 05-15-2017 at  7 pm.  . Sexual activity: Not on file  Other Topics Concern  . Not on file  Social History Narrative  . Not on file          Mardella Layman, MD 08/05/17 [phone]

## 2017-08-06 ENCOUNTER — Telehealth (HOSPITAL_COMMUNITY): Payer: Self-pay | Admitting: Emergency Medicine

## 2017-08-06 LAB — URINE CULTURE: Culture: >=100000 — AB

## 2017-08-06 MED ORDER — AZITHROMYCIN 250 MG PO TABS: 1000.0000 mg | ORAL_TABLET | Freq: Once | ORAL | 0 refills | Status: AC

## 2017-08-06 NOTE — Telephone Encounter (Signed)
Pt called needing lab results... Reports she saw abn results on MyChart (Gc/Chlam, Trich, Urine Culture) Pt ID'd properly Pt positive for Gc and Chlam... [facility] w/Rocephine 250 mg and 1 g of Azithromycin Reports she vomited 30 minutes after leaving [facility] and getting her azithromycin  Per Dr. Milus GlazierLauenstein... Ok to call in Zithromax 1g #1 dose, no refills Rx sent to [facility] on [address] per her request.  Pt positive for E coli and sensitive to trimethoprim/sulfa [facility] and tolerating well.  Adv pt if sx are not getting better to return or to f/u w/PCP Education on safe sex given Also adv pt to notify partner(s) [facility] Pt verb understanding.

## 2017-08-07 ENCOUNTER — Telehealth (HOSPITAL_COMMUNITY): Payer: Self-pay | Admitting: Internal Medicine

## 2017-08-07 LAB — URINE CYTOLOGY ANCILLARY ONLY: Candida vaginitis: NEGATIVE

## 2017-08-07 MED ORDER — METRONIDAZOLE 500 MG PO TABS: 500.0000 mg | ORAL_TABLET | Freq: Two times a day (BID) | ORAL | 0 refills | Status: DC

## 2017-08-07 NOTE — Telephone Encounter (Signed)
Summary of recent abnormal lab results: Trichomonas, rx for metronidazole sent to the pharmacy of record today 2/23, [facility] [address] [facility] [address]. UTI, treated appropriately with rx trimethoprim/sulfa on 2/20. Chlamydia, [facility] with po zithromax; pt vomited this and repeat dose sent to pharmacy yesterday 2/22. Please refrain from sexual intercourse for 7 days to give the medicine time to work.  Sexual partners need to be notified and tested/treated.  Condoms may reduce risk of reinfection.  Recheck for further evaluation if symptoms are not improving.

## 2017-09-10 IMAGING — CR DG CHEST 2V
2 series · 2 of 2 positions shown · non-contrast
Comparison: None.

CLINICAL DATA: Two-day history of mid and right-sided chest pain,
shortness of breath and productive cough. Current history of asthma.

EXAM:
CHEST  2 VIEW

[chest pa]
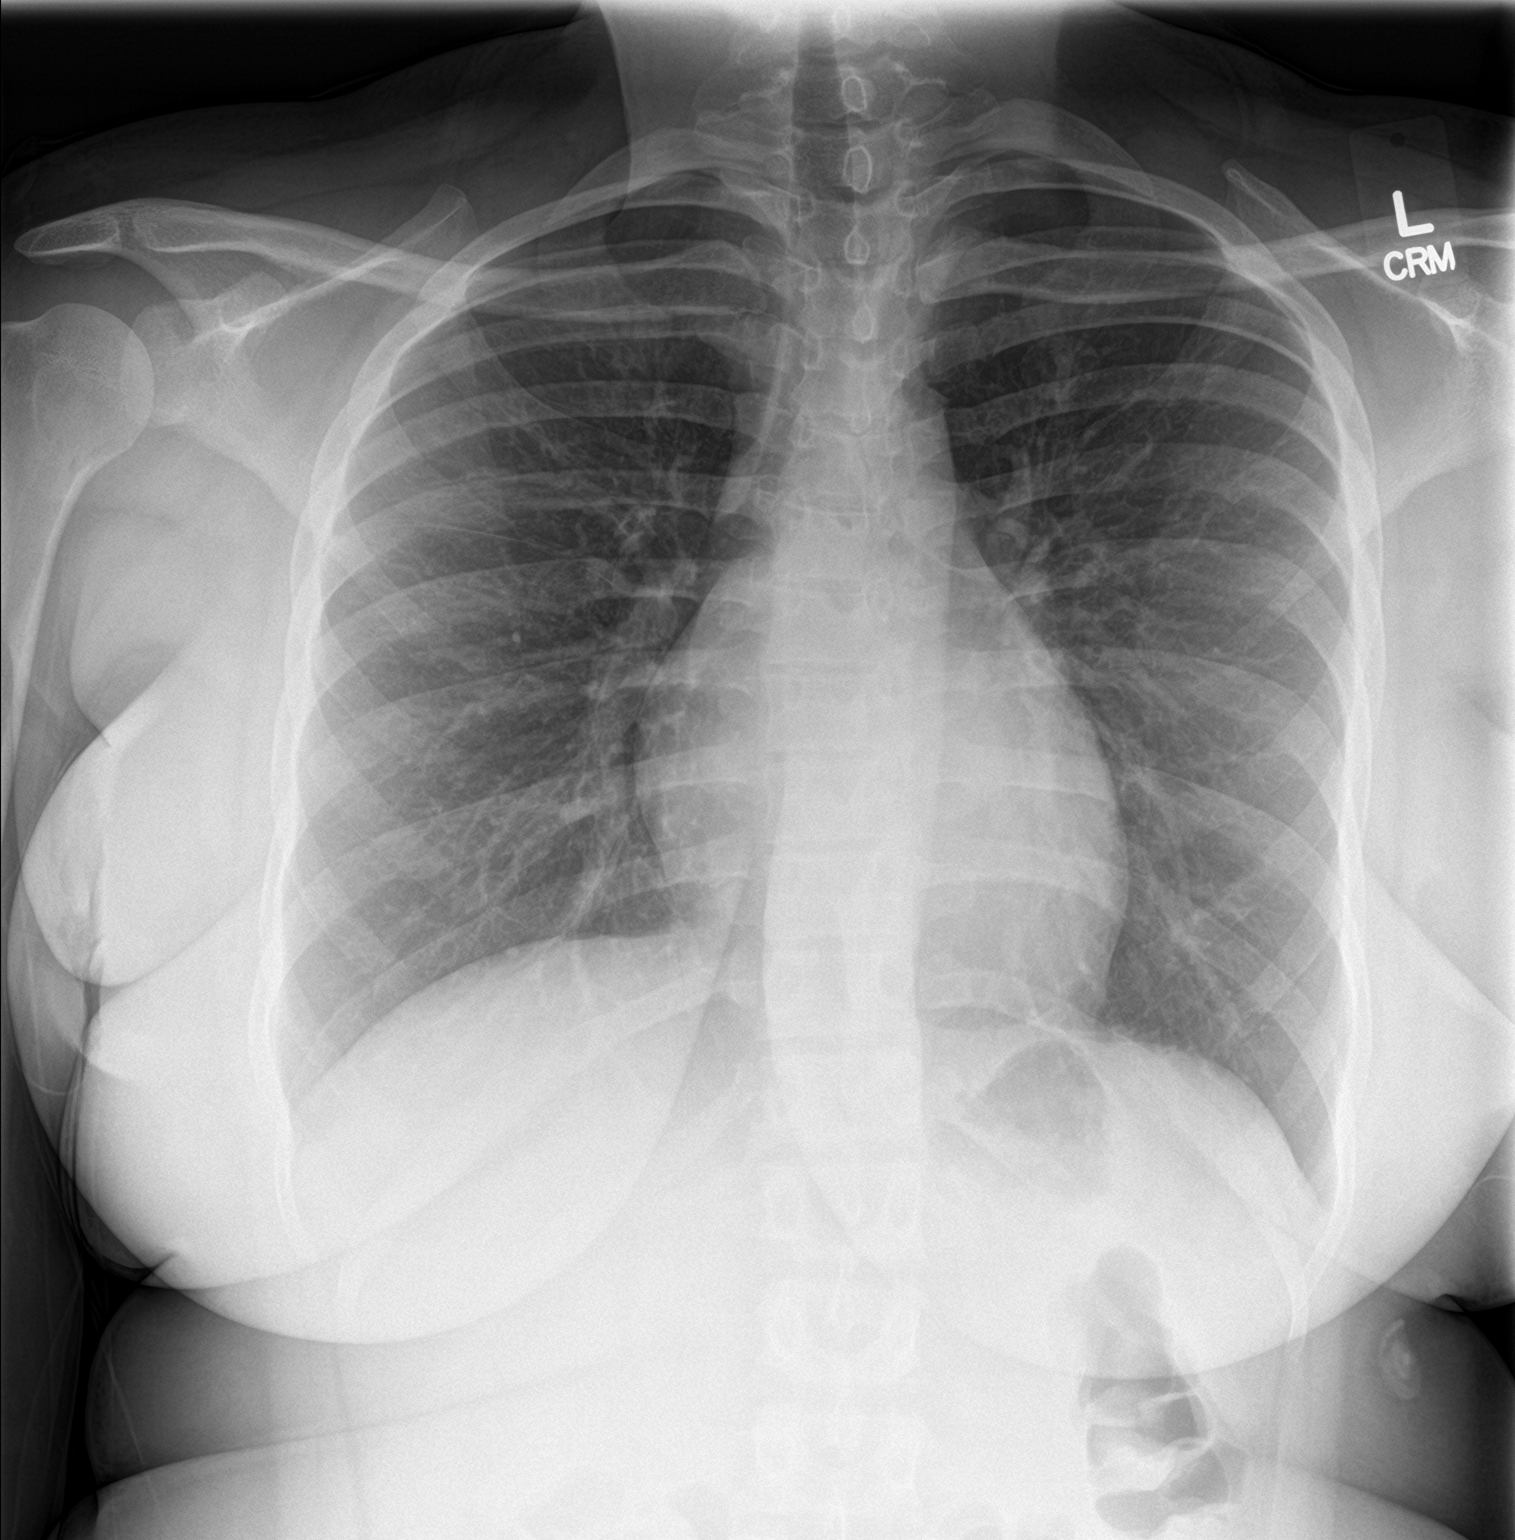

[chest lat]
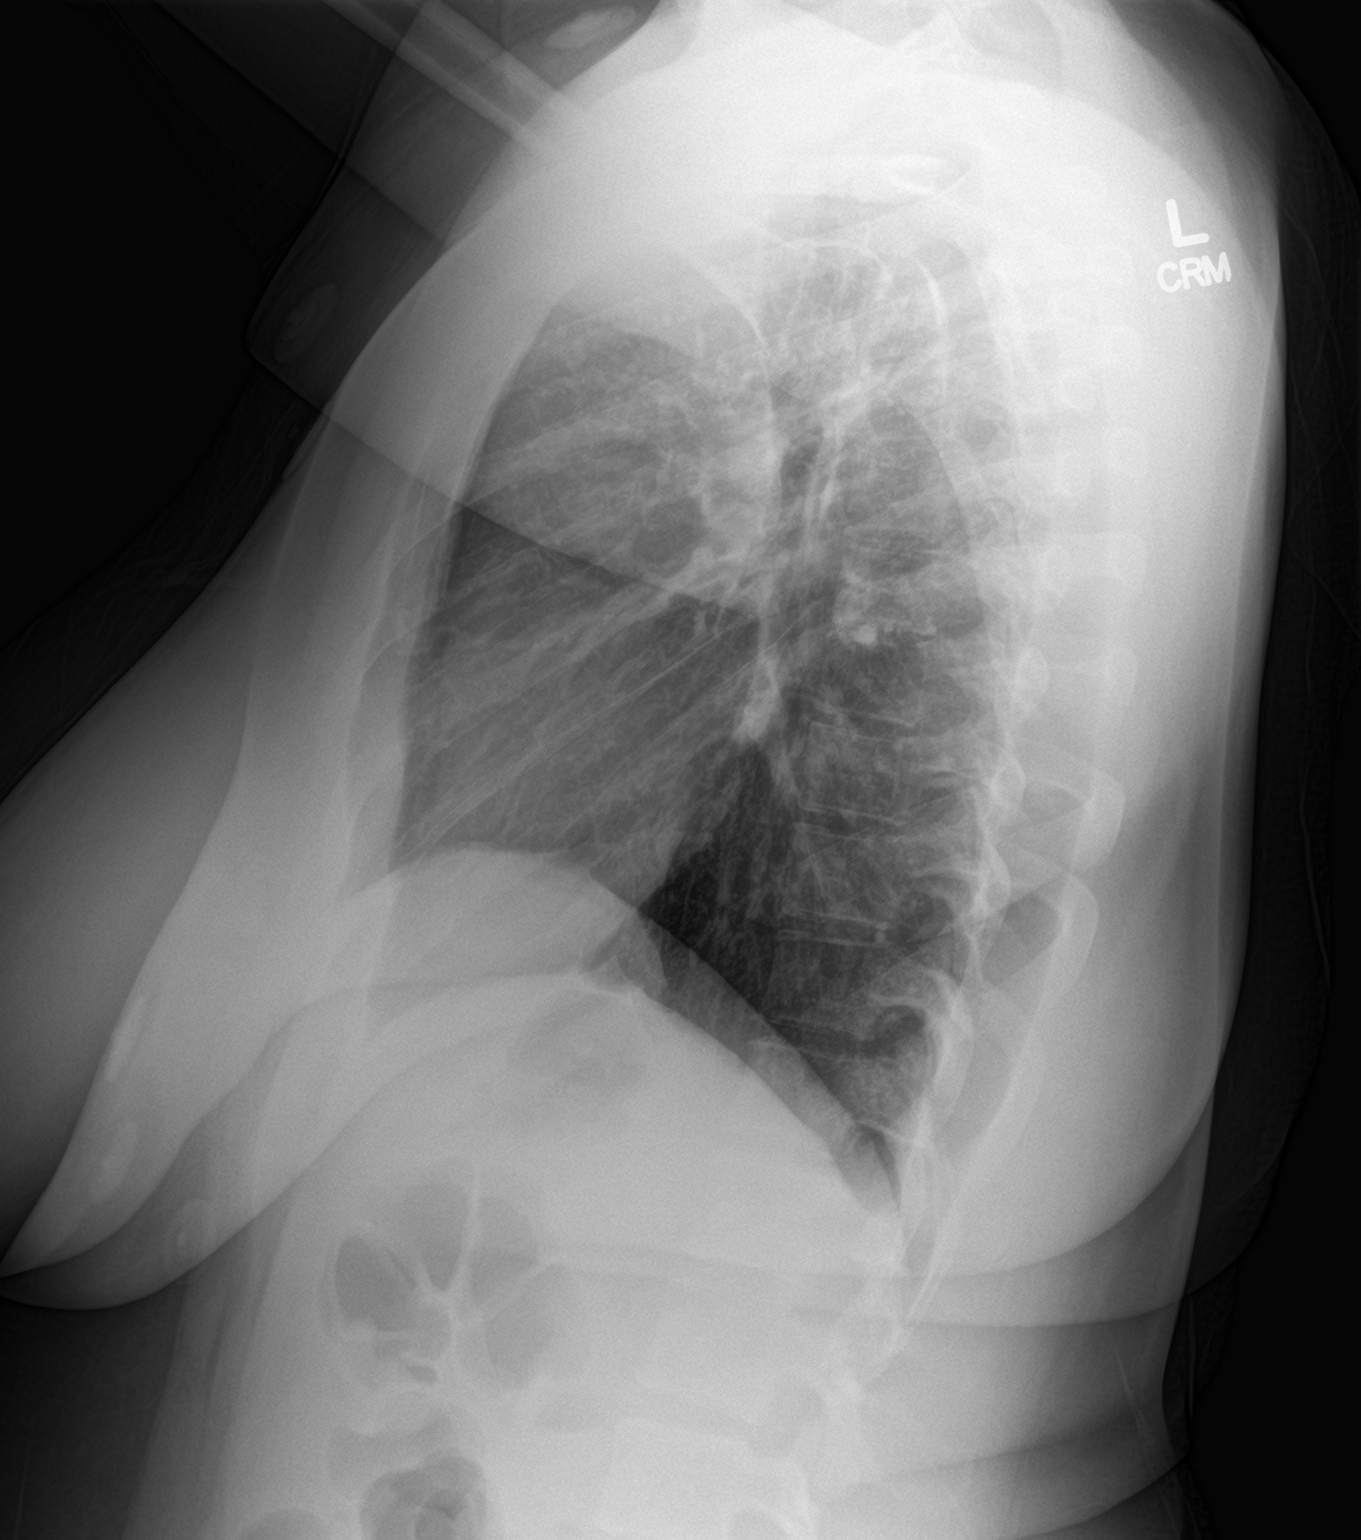

[2 of 2 positions shown; findings below may reference images not displayed]

FINDINGS: Cardiomediastinal silhouette unremarkable. Lungs clear.
Bronchovascular markings normal. Pulmonary vascularity normal. No
pneumothorax. No pleural effusions. Visualized bony thorax intact.
IMPRESSION: Normal examination.

## 2018-03-09 ENCOUNTER — Encounter (HOSPITAL_COMMUNITY): Payer: Self-pay | Admitting: Emergency Medicine

## 2018-03-09 ENCOUNTER — Ambulatory Visit (HOSPITAL_COMMUNITY)
Admission: EM | Admit: 2018-03-09 | Discharge: 2018-03-09 | Disposition: A | Discharge status: Home / Self Care | Payer: Self-pay | Attending: Family Medicine | Admitting: Family Medicine

## 2018-03-09 DIAGNOSIS — L309 Dermatitis, unspecified: Secondary | ICD-10-CM

## 2018-03-09 MED ORDER — TRIAMCINOLONE ACETONIDE 0.1 % EX CREA: 1.0000 "application " | TOPICAL_CREAM | Freq: Two times a day (BID) | CUTANEOUS | 1 refills | Status: DC

## 2018-03-09 MED ORDER — PREDNISONE 10 MG (48) PO TBPK: ORAL_TABLET | ORAL | 0 refills | Status: DC

## 2018-03-09 NOTE — ED Triage Notes (Signed)
Pt states shes had difficulty with her eczema, ran out of her ointment, states they dont give her enough medicine to cover the whole area.

## 2018-03-09 NOTE — ED Provider Notes (Signed)
[facility]   [id number] 03/09/18 Arrival Time: 1417  ASSESSMENT & PLAN:  1. Eczema, unspecified type     Meds ordered this encounter  Medications  . predniSONE (STERAPRED UNI-PAK 48 TAB) 10 MG (48) TBPK tablet    Sig: Take as directed.    Dispense:  48 tablet    Refill:  0  . triamcinolone cream (KENALOG) 0.1 %    Sig: Apply 1 application topically 2 (two) times daily.    Dispense:  453.6 g    Refill:  1   Will follow up with PCP or here if worsening or failing to improve as anticipated. Reviewed expectations re: course of current medical issues. Questions answered. Outlined signs and symptoms indicating need for more acute intervention. Patient verbalized understanding. After Visit Summary given.   SUBJECTIVE:  Amber Lyons is a 22 y.o. female who presents with a skin complaint.   Eczema Reports history of eczema with occasional exacerbations. More frequent exacerbations over the past week. Recent exacerbations and is out of her dermatologic medications. Requests refill. Diffuse over body; seems worse on extremities. Itching. Hot showers dries skin and seems to worsen symptoms. No drainage from areas. No new skin exposures.  ROS: As per HPI.  OBJECTIVE: Vitals:   03/09/18 1527 03/09/18 1529  BP:  120/74  Pulse: 84   Resp: 18   Temp: (!) 97.5 F (36.4 C)   SpO2: 97%     General appearance: alert; no distress Lungs: clear to auscultation bilaterally Heart: regular rate and rhythm Extremities: no edema Skin: warm and dry; signs of infection: no; significant eczema over extremities and torso; some on neck Psychological: alert and cooperative; normal mood and affect  Allergies  Allergen Reactions  . Penicillins Swelling    Past Medical History:  Diagnosis Date  . Asthma    Social History   Socioeconomic History  . Marital status: Married    Spouse name: Not on file  . Number of children: Not on file  . Years of education: Not on file  .  Highest education level: Not on file  Occupational History  . Not on file  Social Needs  . Financial resource strain: Not on file  . Food insecurity:    Worry: Not on file    Inability: Not on file  . Transportation needs:    Medical: Not on file    Non-medical: Not on file  Tobacco Use  . Smoking status: Former Smoker    Packs/day: 2.00    Types: Cigars  . Smokeless tobacco: Never Used  Substance and Sexual Activity  . Alcohol use: Yes    Comment: social  . Drug use: Yes    Types: Marijuana    Comment: last use marijuana was 05-15-2017 at 7 pm.  . Sexual activity: Not on file  Lifestyle  . Physical activity:    Days per week: Not on file    Minutes per session: Not on file  . Stress: Not on file  Relationships  . Social connections:    Talks on phone: Not on file    Gets together: Not on file    Attends religious service: Not on file    Active member of club or organization: Not on file    Attends meetings of clubs or organizations: Not on file    Relationship status: Not on file  . Intimate partner violence:    Fear of current or ex partner: Not on file    Emotionally abused:  Not on file    Physically abused: Not on file    Forced sexual activity: Not on file  Other Topics Concern  . Not on file  Social History Narrative  . Not on file   No family history on file. Past Surgical History:  Procedure Laterality Date  . NO PAST SURGERIES       Mardella Layman, MD 03/17/18 [phone]

## 2019-04-06 ENCOUNTER — Ambulatory Visit (HOSPITAL_COMMUNITY)
Admission: EM | Admit: 2019-04-06 | Discharge: 2019-04-06 | Disposition: A | Discharge status: Home / Self Care | Payer: Self-pay | Attending: Family Medicine | Admitting: Family Medicine

## 2019-04-06 ENCOUNTER — Encounter (HOSPITAL_COMMUNITY): Payer: Self-pay

## 2019-04-06 ENCOUNTER — Other Ambulatory Visit: Payer: Self-pay

## 2019-04-06 DIAGNOSIS — Z113 Encounter for screening for infections with a predominantly sexual mode of transmission: Secondary | ICD-10-CM | POA: Insufficient documentation

## 2019-04-06 DIAGNOSIS — N898 Other specified noninflammatory disorders of vagina: Secondary | ICD-10-CM | POA: Insufficient documentation

## 2019-04-06 DIAGNOSIS — Z3202 Encounter for pregnancy test, result negative: Secondary | ICD-10-CM

## 2019-04-06 LAB — POCT URINALYSIS DIP (DEVICE)
Bilirubin Urine: NEGATIVE
Glucose, UA: NEGATIVE mg/dL
Ketones, ur: NEGATIVE mg/dL
Nitrite: NEGATIVE
Protein, ur: NEGATIVE mg/dL
Specific Gravity, Urine: >=1.03 (ref 1.005–1.030)
Urobilinogen, UA: 0.2 mg/dL (ref 0.0–1.0)
pH: 6 (ref 5.0–8.0)

## 2019-04-06 LAB — RAPID HIV SCREEN (HIV 1/2 AB+AG)
HIV 1/2 Antibodies: NONREACTIVE
HIV-1 P24 Antigen - HIV24: NONREACTIVE

## 2019-04-06 LAB — POCT PREGNANCY, URINE: Preg Test, Ur: NEGATIVE

## 2019-04-06 MED ORDER — TRIAMCINOLONE ACETONIDE 0.1 % EX CREA: 1.0000 "application " | TOPICAL_CREAM | Freq: Two times a day (BID) | CUTANEOUS | 1 refills | Status: DC

## 2019-04-06 NOTE — Discharge Instructions (Signed)
Refilling your triamcinolone cream for eczema flare. We will send urine for culture and swab for culture and call you with any positive results and treat as needed. Follow up as needed for continued or worsening symptoms

## 2019-04-06 NOTE — ED Provider Notes (Signed)
[place]    CSN: [id number] Arrival date & time: 04/06/19  9563      History   Chief Complaint Chief Complaint  Patient presents with  . Eczema  . SEXUALLY TRANSMITTED DISEASE    HPI Amber Lyons is a 23 y.o. female.   Patient is a 23 year old female presents today for eczema flare and wanting STD screening.  Reporting some vaginal swelling and irritation.  This is been present over the past few days.  She has some vaginal discharge after sexual intercourse with a new partner.  History of BV.  Last menstrual period was 25 September.  She has had some mild intermittent abdominal cramping.  No fever, dysuria, hematuria or urinary frequency.  She also would like a refill on triamcinolone cream for eczema flare.  She has currently run out and this works best for her eczema.  ROS per HPI      Past Medical History:  Diagnosis Date  . Asthma     There are no active problems to display for this patient.   Past Surgical History:  Procedure Laterality Date  . NO PAST SURGERIES      OB History    Gravida  0   Para  0   Term  0   Preterm  0   AB  0   Living  0     SAB  0   TAB  0   Ectopic  0   Multiple  0   Live Births  0            Home Medications    Prior to Admission medications   Medication Sig Start Date End Date Taking? Authorizing Provider  triamcinolone cream (KENALOG) 0.1 % Apply 1 application topically 2 (two) times daily. 04/06/19   Loura Halt A, NP  ranitidine (ZANTAC) 150 MG tablet Take 1 tablet (150 mg total) by mouth 2 (two) times daily for 5 days. Patient not taking: Reported on 08/04/2017 05/16/17 04/06/19  Duffy Bruce, MD    Family History Family History  Problem Relation Age of Onset  . Asthma Mother   . Asthma Father     Social History Social History   Tobacco Use  . Smoking status: Former Smoker    Packs/day: 2.00    Types: Cigars  . Smokeless tobacco: Never Used  Substance Use Topics  .  Alcohol use: Yes    Comment: social  . Drug use: Yes    Types: Marijuana    Comment: last use marijuana was 05-15-2017 at 7 pm.     Allergies   Penicillins   Review of Systems Review of Systems   Physical Exam Triage Vital Signs ED Triage Vitals  Enc Vitals Group     BP 04/06/19 0953 117/61     Pulse Rate 04/06/19 0953 68     Resp 04/06/19 0953 16     Temp 04/06/19 0953 98.9 F (37.2 C)     Temp Source 04/06/19 0953 Oral     SpO2 04/06/19 0953 99 %     Weight --      Height --      Head Circumference --      Peak Flow --      Pain Score 04/06/19 0951 0     Pain Loc --      Pain Edu? --      Excl. in [place]? --    No data found.  Updated Vital Signs BP 117/61 (  BP Location: Right Arm)   Pulse 68   Temp 98.9 F (37.2 C) (Oral)   Resp 16   SpO2 99%   Visual Acuity Right Eye Distance:   Left Eye Distance:   Bilateral Distance:    Right Eye Near:   Left Eye Near:    Bilateral Near:     Physical Exam Vitals signs and nursing note reviewed.  Constitutional:      General: She is not in acute distress.    Appearance: Normal appearance. She is not ill-appearing, toxic-appearing or diaphoretic.  HENT:     Head: Normocephalic.     Nose: Nose normal.     Mouth/Throat:     Pharynx: Oropharynx is clear.  Eyes:     Conjunctiva/sclera: Conjunctivae normal.  Neck:     Musculoskeletal: Normal range of motion.  Pulmonary:     Effort: Pulmonary effort is normal.  Abdominal:     Palpations: Abdomen is soft.     Tenderness: There is no abdominal tenderness.  Musculoskeletal: Normal range of motion.  Skin:    General: Skin is warm and dry.     Findings: Rash present.     Comments: Diffuse eczematous patches.   Neurological:     Mental Status: She is alert.  Psychiatric:        Mood and Affect: Mood normal.      UC Treatments / Results  Labs (all labs ordered are listed, but only abnormal results are displayed) Labs Reviewed  POCT URINALYSIS DIP (DEVICE) -  Abnormal; Notable for the following components:      Result Value   Hgb urine dipstick TRACE (*)    Leukocytes,Ua TRACE (*)    All other components within normal limits  URINE CULTURE  RAPID HIV SCREEN (HIV 1/2 AB+AG)  RPR  POC URINE PREG, ED  POCT PREGNANCY, URINE  CERVICOVAGINAL ANCILLARY ONLY    EKG   Radiology No results found.  Procedures Procedures (including critical care time)  Medications Ordered in UC Medications - No data to display  Initial Impression / Assessment and Plan / UC Course  I have reviewed the triage vital signs and the nursing notes.  Pertinent labs & imaging results that were available during my care of the patient were reviewed by me and considered in my medical decision making (see chart for details).     Eczema-refill triamcinolone cream  Concern for STD with vaginal swelling and irritation.  Sending self swab for STD testing and BV yeast. Urine with trace leuks and trace hemoglobin.  Will send for culture. Opting to not treat until we receive culture results. Also testing for BV and syphilis.   Final Clinical Impressions(s) / UC Diagnoses   Final diagnoses:  Vaginal irritation  Screen for STD (sexually transmitted disease)     Discharge Instructions     Refilling your triamcinolone cream for eczema flare. We will send urine for culture and swab for culture and call you with any positive results and treat as needed. Follow up as needed for continued or worsening symptoms     ED Prescriptions    Medication Sig Dispense Auth. Provider   triamcinolone cream (KENALOG) 0.1 % Apply 1 application topically 2 (two) times daily. 453.6 g Dahlia Byes A, NP     PDMP not reviewed this encounter.   Dahlia Byes A, NP 04/06/19 1105

## 2019-04-06 NOTE — ED Triage Notes (Signed)
Patient presents to Urgent Care with complaints of eczema flare up on her wrists and thighs since the past few days. Patient reports she would also like to be tested for STDs since having vaginal discharge after having unprotected intercourse.

## 2019-04-07 LAB — RPR: RPR Ser Ql: NONREACTIVE

## 2019-04-08 LAB — URINE CULTURE: Culture: 40000 — AB

## 2019-04-13 LAB — CERVICOVAGINAL ANCILLARY ONLY
Bacterial vaginitis: POSITIVE — AB
Candida vaginitis: POSITIVE — AB
Chlamydia: POSITIVE — AB
Neisseria Gonorrhea: NEGATIVE
Trichomonas: NEGATIVE

## 2019-04-14 ENCOUNTER — Telehealth (HOSPITAL_COMMUNITY): Payer: Self-pay | Admitting: Emergency Medicine

## 2019-04-14 ENCOUNTER — Encounter (HOSPITAL_COMMUNITY): Payer: Self-pay

## 2019-04-14 MED ORDER — FLUCONAZOLE 150 MG PO TABS: 150.0000 mg | ORAL_TABLET | Freq: Once | ORAL | 0 refills | Status: AC

## 2019-04-14 MED ORDER — AZITHROMYCIN 250 MG PO TABS: 1000.0000 mg | ORAL_TABLET | Freq: Once | ORAL | 0 refills | Status: AC

## 2019-04-14 MED ORDER — METRONIDAZOLE 500 MG PO TABS: 500.0000 mg | ORAL_TABLET | Freq: Two times a day (BID) | ORAL | 0 refills | Status: AC

## 2019-04-14 MED ORDER — TRIAMCINOLONE ACETONIDE 0.1 % EX CREA: 1.0000 "application " | TOPICAL_CREAM | Freq: Two times a day (BID) | CUTANEOUS | 1 refills | Status: DC

## 2019-04-14 NOTE — Telephone Encounter (Signed)
Review mychart messages between me and patient.

## 2019-04-14 NOTE — Telephone Encounter (Signed)
Chlamydia is positive.  Rx po zithromax 1g #1 dose no refills was sent to the pharmacy of record.  Pt needs education to please refrain from sexual intercourse for 7 days to give the medicine time to work, sexual partners need to be notified and tested/treated.  Condoms may reduce risk of reinfection.  Recheck or followup with PCP for further evaluation if symptoms are not improving.   [facility] notified.  Bacterial vaginosis is positive. This was not treated at the urgent care visit.  Flagyl 500 mg BID x 7 days #14 no refills sent to patients pharmacy of choice.    Test for candida (yeast) was positive.  Prescription for fluconazole 150mg  po now, repeat dose in 3d if needed, #2 no refills, sent to the pharmacy of record.  Recheck or followup with PCP for further evaluation if symptoms are not improving.    Attempted to reach patient. No answer at this time. Voicemail left.  Mychart message sent.

## 2019-09-13 ENCOUNTER — Telehealth: Payer: Medicaid Other | Admitting: Family

## 2019-09-13 DIAGNOSIS — L309 Dermatitis, unspecified: Secondary | ICD-10-CM

## 2019-09-13 MED ORDER — TRIAMCINOLONE ACETONIDE 0.1 % EX CREA: 1.0000 "application " | TOPICAL_CREAM | Freq: Two times a day (BID) | CUTANEOUS | 1 refills | Status: DC

## 2019-09-13 NOTE — Progress Notes (Signed)
E Visit for Rash  We are sorry that you are not feeling well. Here is how we plan to help!  It looks like you have eczema. I have sent you a prescription of kenalog cream that you will apply twice a day.   HOME CARE:   Take cool showers and avoid direct sunlight.  Apply cool compress or wet dressings.  Take a bath in an oatmeal bath.  Sprinkle content of one Aveeno packet under running faucet with comfortably warm water.  Bathe for 15-20 minutes, 1-2 times daily.  Pat dry with a towel. Do not rub the rash.  Use hydrocortisone cream.  Take an antihistamine like Benadryl for widespread rashes that itch.  The adult dose of Benadryl is 25-50 mg by mouth 4 times daily.  Caution:  This type of medication may cause sleepiness.  Do not drink alcohol, drive, or operate dangerous machinery while taking antihistamines.  Do not take these medications if you have prostate enlargement.  Read package instructions thoroughly on all medications that you take.  GET HELP RIGHT AWAY IF:   Symptoms don't go away after treatment.  Severe itching that persists.  If you rash spreads or swells.  If you rash begins to smell.  If it blisters and opens or develops a yellow-brown crust.  You develop a fever.  You have a sore throat.  You become short of breath.  MAKE SURE YOU:  Understand these instructions. Will watch your condition. Will get help right away if you are not doing well or get worse.  Thank you for choosing an e-visit. Your e-visit answers were reviewed by a board certified advanced clinical practitioner to complete your personal care plan. Depending upon the condition, your plan could have included both over the counter or prescription medications. Please review your pharmacy choice. Be sure that the pharmacy you have chosen is open so that you can pick up your prescription now.  If there is a problem you may message your provider in MyChart to have the prescription routed to  another pharmacy. Your safety is important to [place]. If you have drug allergies check your prescription carefully.  For the next 24 hours, you can use MyChart to ask questions about today's visit, request a non-urgent call back, or ask for a work or school excuse from your e-visit provider. You will get an email in the next two days asking about your experience. I hope that your e-visit has been valuable and will speed your recovery.   Approximately 5 minutes was spent documenting and reviewing patient's chart.

## 2019-09-14 ENCOUNTER — Inpatient Hospital Stay: Admission: RE | Admit: 2019-09-14 | Payer: Medicaid Other | Source: Ambulatory Visit

## 2019-10-11 ENCOUNTER — Telehealth: Payer: Medicaid Other | Admitting: Family

## 2019-10-11 DIAGNOSIS — L309 Dermatitis, unspecified: Secondary | ICD-10-CM

## 2019-10-11 DIAGNOSIS — L209 Atopic dermatitis, unspecified: Secondary | ICD-10-CM

## 2019-10-11 MED ORDER — TRIAMCINOLONE ACETONIDE 0.1 % EX CREA: 1.0000 "application " | TOPICAL_CREAM | Freq: Two times a day (BID) | CUTANEOUS | 1 refills | Status: DC

## 2019-10-11 MED ORDER — FLUTICASONE PROPIONATE 50 MCG/ACT NA SUSP: 2.0000 | Freq: Every day | NASAL | 6 refills | Status: DC

## 2019-10-11 MED ORDER — CETIRIZINE HCL 10 MG PO TABS: 10.0000 mg | ORAL_TABLET | Freq: Every day | ORAL | 11 refills | Status: DC

## 2019-10-11 NOTE — Progress Notes (Signed)
E Visit for Rash  We are sorry that you are not feeling well. Here is how we plan to help!   It looks like you have a topic dermatitis or eczema. I have refilled your kenalogl cream, you need to start taking a Zyrtec, and use Flonase daily. You also need to follow up with your PCP or dermatologist. You also need to use a thick moisturizer after a shower. Avoid scratching. Avoid any harsh chemicals are irritants.    Approximately 5 minutes was spent documenting and reviewing patient's chart.    HOME CARE:   Take cool showers and avoid direct sunlight.  Apply cool compress or wet dressings.  Take a bath in an oatmeal bath.  Sprinkle content of one Aveeno packet under running faucet with comfortably warm water.  Bathe for 15-20 minutes, 1-2 times daily.  Pat dry with a towel. Do not rub the rash.  Use hydrocortisone cream.  Take an antihistamine like Benadryl for widespread rashes that itch.  The adult dose of Benadryl is 25-50 mg by mouth 4 times daily.  Caution:  This type of medication may cause sleepiness.  Do not drink alcohol, drive, or operate dangerous machinery while taking antihistamines.  Do not take these medications if you have prostate enlargement.  Read package instructions thoroughly on all medications that you take.  GET HELP RIGHT AWAY IF:   Symptoms don't go away after treatment.  Severe itching that persists.  If you rash spreads or swells.  If you rash begins to smell.  If it blisters and opens or develops a yellow-brown crust.  You develop a fever.  You have a sore throat.  You become short of breath.  MAKE SURE YOU:  Understand these instructions. Will watch your condition. Will get help right away if you are not doing well or get worse.  Thank you for choosing an e-visit. Your e-visit answers were reviewed by a board certified advanced clinical practitioner to complete your personal care plan. Depending upon the condition, your plan could have  included both over the counter or prescription medications. Please review your pharmacy choice. Be sure that the pharmacy you have chosen is open so that you can pick up your prescription now.  If there is a problem you may message your provider in MyChart to have the prescription routed to another pharmacy. Your safety is important to [place]. If you have drug allergies check your prescription carefully.  For the next 24 hours, you can use MyChart to ask questions about today's visit, request a non-urgent call back, or ask for a work or school excuse from your e-visit provider. You will get an email in the next two days asking about your experience. I hope that your e-visit has been valuable and will speed your recovery.

## 2019-10-20 ENCOUNTER — Other Ambulatory Visit: Payer: Self-pay | Admitting: Family

## 2019-10-20 NOTE — Progress Notes (Signed)
Approximately 5 minutes was spent documenting and reviewing patient's chart.   

## 2019-10-27 ENCOUNTER — Other Ambulatory Visit: Payer: Self-pay

## 2019-10-27 ENCOUNTER — Encounter: Payer: Self-pay | Admitting: Emergency Medicine

## 2019-10-27 ENCOUNTER — Ambulatory Visit
Admission: EM | Admit: 2019-10-27 | Discharge: 2019-10-27 | Disposition: A | Discharge status: Home / Self Care | Payer: Medicaid Other | Attending: Physician Assistant | Admitting: Physician Assistant

## 2019-10-27 ENCOUNTER — Ambulatory Visit (INDEPENDENT_AMBULATORY_CARE_PROVIDER_SITE_OTHER)
Admission: RE | Admit: 2019-10-27 | Discharge: 2019-10-27 | Disposition: A | Discharge status: Other Acute Inpatient Hospital | Payer: Medicaid Other | Source: Ambulatory Visit

## 2019-10-27 DIAGNOSIS — L089 Local infection of the skin and subcutaneous tissue, unspecified: Secondary | ICD-10-CM

## 2019-10-27 DIAGNOSIS — L723 Sebaceous cyst: Secondary | ICD-10-CM

## 2019-10-27 DIAGNOSIS — H00011 Hordeolum externum right upper eyelid: Secondary | ICD-10-CM

## 2019-10-27 DIAGNOSIS — L0291 Cutaneous abscess, unspecified: Secondary | ICD-10-CM

## 2019-10-27 HISTORY — DX: Dermatitis, unspecified: L30.9

## 2019-10-27 MED ORDER — MUPIROCIN 2 % EX OINT: 1.0000 "application " | TOPICAL_OINTMENT | Freq: Two times a day (BID) | CUTANEOUS | 0 refills | Status: DC

## 2019-10-27 NOTE — Discharge Instructions (Addendum)
Come to the Urgent Care for in-person evaluation.   

## 2019-10-27 NOTE — ED Provider Notes (Signed)
[facility]    CSN: [id number] Arrival date & time: 10/27/19  1615      History   Chief Complaint Chief Complaint  Patient presents with  . Cyst    HPI Amber Lyons is a 24 y.o. female.   24 year old female comes in for cyst to the left side of the head for the past 8 days.  Denies injury/trauma.  States area has gotten larger, and tender to touch.  Denies spreading erythema, warmth, fever.  Has not tried anything for the symptoms.     Past Medical History:  Diagnosis Date  . Asthma   . Eczema     There are no problems to display for this patient.   Past Surgical History:  Procedure Laterality Date  . NO PAST SURGERIES      OB History    Gravida  0   Para  0   Term  0   Preterm  0   AB  0   Living  0     SAB  0   TAB  0   Ectopic  0   Multiple  0   Live Births  0            Home Medications    Prior to Admission medications   Medication Sig Start Date End Date Taking? Authorizing Rilley Poulter  cetirizine (ZYRTEC) 10 MG tablet Take 1 tablet (10 mg total) by mouth daily. 10/11/19   Sharion Balloon, FNP  fluticasone (FLONASE) 50 MCG/ACT nasal spray Place 2 sprays into both nostrils daily. 10/11/19   Sharion Balloon, FNP  mupirocin ointment (BACTROBAN) 2 % Apply 1 application topically 2 (two) times daily. 10/27/19   Tasia Catchings, Amy V, PA-C  triamcinolone cream (KENALOG) 0.1 % Apply 1 application topically 2 (two) times daily. 10/11/19   Sharion Balloon, FNP  ranitidine (ZANTAC) 150 MG tablet Take 1 tablet (150 mg total) by mouth 2 (two) times daily for 5 days. Patient not taking: Reported on 08/04/2017 05/16/17 04/06/19  Duffy Bruce, MD    Family History Family History  Problem Relation Age of Onset  . Asthma Mother   . Asthma Father     Social History Social History   Tobacco Use  . Smoking status: Former Smoker    Packs/day: 2.00    Types: Cigars  . Smokeless tobacco: Never Used  Substance Use Topics  . Alcohol use: Yes     Comment: social  . Drug use: Yes    Types: Marijuana    Comment: last use marijuana was 05-15-2017 at 7 pm.     Allergies   Penicillins   Review of Systems Review of Systems  Reason unable to perform ROS: See HPI as above.     Physical Exam Triage Vital Signs ED Triage Vitals [10/27/19 1627]  Enc Vitals Group     BP 122/71     Pulse Rate 96     Resp 18     Temp 97.6 F (36.4 C)     Temp Source Oral     SpO2 97 %     Weight      Height      Head Circumference      Peak Flow      Pain Score 5     Pain Loc      Pain Edu?      Excl. in [place]?    No data found.  Updated Vital Signs BP 122/71 (BP Location: Left  Arm)   Pulse 96   Temp 97.6 F (36.4 C) (Oral)   Resp 18   SpO2 97%   Physical Exam Constitutional:      General: She is not in acute distress.    Appearance: Normal appearance. She is well-developed. She is not toxic-appearing or diaphoretic.  HENT:     Head: Normocephalic and atraumatic.   Eyes:     Conjunctiva/sclera: Conjunctivae normal.     Pupils: Pupils are equal, round, and reactive to light.  Pulmonary:     Effort: Pulmonary effort is normal. No respiratory distress.     Comments: Speaking in full sentences without difficulty Musculoskeletal:     Cervical back: Normal range of motion and neck supple.  Skin:    General: Skin is warm and dry.  Neurological:     Mental Status: She is alert and oriented to person, place, and time.      UC Treatments / Results  Labs (all labs ordered are listed, but only abnormal results are displayed) Labs Reviewed - No data to display  EKG   Radiology No results found.  Procedures Incision and Drainage  Date/Time: 10/27/2019 7:52 PM Performed by: Belinda Fisher, PA-C Authorized by: Belinda Fisher, PA-C   Consent:    Consent obtained:  Verbal   Consent given by:  Patient   Risks discussed:  Incomplete drainage, infection, pain and bleeding   Alternatives discussed:  Alternative treatment and  referral Location:    Type:  Abscess   Size:  1cm x 1cm    Location:  Head   Head location:  Scalp Pre-procedure details:    Skin preparation:  Chloraprep Anesthesia (see MAR for exact dosages):    Anesthesia method:  Local infiltration   Local anesthetic:  Lidocaine 2% w/o epi Procedure type:    Complexity:  Simple Procedure details:    Incision types:  Single straight   Incision depth:  Dermal   Scalpel blade:  11   Wound management:  Probed and deloculated and irrigated with saline   Drainage:  Purulent   Drainage amount:  Moderate   Wound treatment:  Wound left open   Packing materials:  None Post-procedure details:    Patient tolerance of procedure:  Tolerated well, no immediate complications   (including critical care time)  Medications Ordered in UC Medications - No data to display  Initial Impression / Assessment and Plan / UC Course  I have reviewed the triage vital signs and the nursing notes.  Pertinent labs & imaging results that were available during my care of the patient were reviewed by me and considered in my medical decision making (see chart for details).     Patient tolerated procedure well. No surrounding cellulitis, will monitor for now without abx. Wound care instructions given. Return precautions given. Patient expresses understanding and agrees to plan.   Final Clinical Impressions(s) / UC Diagnoses   Final diagnoses:  Infected sebaceous cyst   ED Prescriptions    Medication Sig Dispense Auth. Tamla Winkels   mupirocin ointment (BACTROBAN) 2 % Apply 1 application topically 2 (two) times daily. 22 g Belinda Fisher, PA-C     PDMP not reviewed this encounter.   Belinda Fisher, PA-C 10/27/19 1954

## 2019-10-27 NOTE — Discharge Instructions (Signed)
Continue to massage to allow continued drainage. Keep wound clean and dry. You can clean gently with soap and water. Do not soak area in water. Monitor for spreading redness, increased warmth, increased swelling, fever, follow up for reevaluation needed.

## 2019-10-27 NOTE — ED Provider Notes (Signed)
Virtual Visit via Video Note:  Amber Lyons  initiated request for Telemedicine visit with [facility] team. I connected with Amber Lyons  on 10/27/2019 at 10:56 AM  for a synchronized telemedicine visit using a video enabled HIPPA compliant telemedicine application. I verified that I am speaking with Amber Lyons  using two identifiers. Mickie Bail, NP  was physically located in a [facility] Urgent care site and TALIAH PORCHE was located at a different location.   The limitations of evaluation and management by telemedicine as well as the availability of in-person appointments were discussed. Patient was informed that she  may incur a bill ( including co-pay) for this virtual visit encounter. Amber Lyons  expressed understanding and gave verbal consent to proceed with virtual visit.     History of Present Illness:Amber Lyons  is a 24 y.o. female presents for evaluation of "knot" on her head and "stye" on her eye.  Tender raised knot on left upper forehead x 1 week; getting larger each day.  No falls or injury.  The stye is on her right upper eyelid x 2 days.  She denies fever or chills.     Allergies  Allergen Reactions  . Penicillins Swelling     Past Medical History:  Diagnosis Date  . Asthma      Social History   Tobacco Use  . Smoking status: Former Smoker    Packs/day: 2.00    Types: Cigars  . Smokeless tobacco: Never Used  Substance Use Topics  . Alcohol use: Yes    Comment: social  . Drug use: Yes    Types: Marijuana    Comment: last use marijuana was 05-15-2017 at 7 pm.    ROS: as stated in HPI.  All other systems reviewed and negative.      Observations/Objective: Physical Exam  VITALS: Patient denies fever. GENERAL: Alert, appears well and in no acute distress. HEENT: Atraumatic.  Right upper eyelid swelling.   NECK: Normal movements of the head and neck. CARDIOPULMONARY: No increased WOB. Speaking in clear sentences. I:E ratio WNL.  MS:  Moves all visible extremities without noticeable abnormality. PSYCH: Pleasant and cooperative, well-groomed. Speech normal rate and rhythm. Affect is appropriate. Insight and judgement are appropriate. Attention is focused, linear, and appropriate.  NEURO: CN grossly intact. Oriented as arrived to appointment on time with no prompting. Moves both UE equally.  SKIN: 2-3 cm raised lesion on left upper forehead at hairline that appears to be an abscess.     Assessment and Plan:    ICD-10-CM   1. Abscess  L02.91   2. Hordeolum externum of right upper eyelid  H00.011        Follow Up Instructions: Discussed treatment options with patient.  She opts to come to the urgent care for in person evaluation.    I discussed the assessment and treatment plan with the patient. The patient was provided an opportunity to ask questions and all were answered. The patient agreed with the plan and demonstrated an understanding of the instructions.   The patient was advised to call back or seek an in-person evaluation if the symptoms worsen or if the condition fails to improve as anticipated.      Mickie Bail, NP  10/27/2019 10:56 AM         Mickie Bail, NP 10/27/19 1056

## 2019-10-27 NOTE — ED Triage Notes (Signed)
Pt here for cyst to left side of head x 8 days and also stye on her right eye; pt denies drainage or injury

## 2019-11-20 ENCOUNTER — Encounter: Payer: Self-pay | Admitting: Emergency Medicine

## 2019-11-20 ENCOUNTER — Ambulatory Visit
Admission: EM | Admit: 2019-11-20 | Discharge: 2019-11-20 | Disposition: A | Discharge status: Home / Self Care | Payer: Medicaid Other | Attending: Physician Assistant | Admitting: Physician Assistant

## 2019-11-20 ENCOUNTER — Other Ambulatory Visit: Payer: Self-pay

## 2019-11-20 DIAGNOSIS — L309 Dermatitis, unspecified: Secondary | ICD-10-CM

## 2019-11-20 MED ORDER — DESONIDE 0.05 % EX CREA: TOPICAL_CREAM | Freq: Two times a day (BID) | CUTANEOUS | 0 refills | Status: DC

## 2019-11-20 MED ORDER — TRIAMCINOLONE ACETONIDE 0.1 % EX CREA: 1.0000 "application " | TOPICAL_CREAM | Freq: Two times a day (BID) | CUTANEOUS | 0 refills | Status: DC

## 2019-11-20 MED ORDER — TRIAMCINOLONE ACETONIDE 0.025 % EX OINT: 1.0000 "application " | TOPICAL_OINTMENT | Freq: Two times a day (BID) | CUTANEOUS | 0 refills | Status: DC

## 2019-11-20 NOTE — ED Triage Notes (Signed)
Patient has a history of eczema.  Patient is out of medications.  Patient requesting refill of medications

## 2019-11-20 NOTE — Discharge Instructions (Signed)
Continue triamcinolone cream as needed. Add triamcinolone ointment at night to more severe areas. Deosnide sparingly to the face. Allergy medicine such as zyrtec daily. Follow up with PCP/dermatology for further evaluation. If showing signs of infection including spreading redness, warmth, pain, follow up for reevaluation.

## 2019-11-20 NOTE — ED Provider Notes (Signed)
[facility]    CSN: [id number] Arrival date & time: 11/20/19  1402      History   Chief Complaint Chief Complaint  Patient presents with  . Eczema    HPI Amber Lyons is a 24 y.o. female.   24 year old female comes in for eczema flare up. States running out of triamcinolone and would like refill. She gets flare ups typically when season changes. Rashes throughout the body including chest, face, extensor extremities. Denies spreading erythema, warmth, fever, pain. Used to see dermatology for this but stopped due to lack of insurance.      Past Medical History:  Diagnosis Date  . Asthma   . Eczema     There are no problems to display for this patient.   Past Surgical History:  Procedure Laterality Date  . NO PAST SURGERIES      OB History    Gravida  0   Para  0   Term  0   Preterm  0   AB  0   Living  0     SAB  0   TAB  0   Ectopic  0   Multiple  0   Live Births  0            Home Medications    Prior to Admission medications   Medication Sig Start Date End Date Taking? Authorizing Provider  desonide (DESOWEN) 0.05 % cream Apply topically 2 (two) times daily. Sparingly on the face if needed. Do not use more than 7 days in a row. 11/20/19   Cathie Hoops, Coral Soler V, PA-C  triamcinolone (KENALOG) 0.025 % ointment Apply 1 application topically 2 (two) times daily. 11/20/19   Cathie Hoops, Christophr Calix V, PA-C  triamcinolone cream (KENALOG) 0.1 % Apply 1 application topically 2 (two) times daily. 11/20/19   Cathie Hoops, Taimur Fier V, PA-C  cetirizine (ZYRTEC) 10 MG tablet Take 1 tablet (10 mg total) by mouth daily. 10/11/19 11/20/19  Junie Spencer, FNP  fluticasone (FLONASE) 50 MCG/ACT nasal spray Place 2 sprays into both nostrils daily. 10/11/19 11/20/19  Junie Spencer, FNP  ranitidine (ZANTAC) 150 MG tablet Take 1 tablet (150 mg total) by mouth 2 (two) times daily for 5 days. Patient not taking: Reported on 08/04/2017 05/16/17 04/06/19  Shaune Pollack, MD    Family History Family  History  Problem Relation Age of Onset  . Asthma Mother   . Asthma Father     Social History Social History   Tobacco Use  . Smoking status: Former Smoker    Packs/day: 2.00    Types: Cigars  . Smokeless tobacco: Never Used  Substance Use Topics  . Alcohol use: Yes    Comment: social  . Drug use: Yes    Types: Marijuana    Comment: last use marijuana was 05-15-2017 at 7 pm.     Allergies   Penicillins   Review of Systems Review of Systems  Reason unable to perform ROS: See HPI as above.     Physical Exam Triage Vital Signs ED Triage Vitals  Enc Vitals Group     BP 11/20/19 1414 111/72     Pulse Rate 11/20/19 1414 67     Resp 11/20/19 1414 18     Temp 11/20/19 1414 97.9 F (36.6 C)     Temp Source 11/20/19 1414 Oral     SpO2 11/20/19 1414 98 %     Weight --      Height --  Head Circumference --      Peak Flow --      Pain Score 11/20/19 1411 0     Pain Loc --      Pain Edu? --      Excl. in [place]? --    No data found.  Updated Vital Signs BP 111/72 (BP Location: Left Arm) Comment (BP Location): large cuff  Pulse 67   Temp 97.9 F (36.6 C) (Oral)   Resp 18   LMP 11/06/2019   SpO2 98%   Physical Exam Constitutional:      General: She is not in acute distress.    Appearance: Normal appearance. She is well-developed. She is not toxic-appearing or diaphoretic.  HENT:     Head: Normocephalic and atraumatic.  Eyes:     Conjunctiva/sclera: Conjunctivae normal.     Pupils: Pupils are equal, round, and reactive to light.  Pulmonary:     Effort: Pulmonary effort is normal. No respiratory distress.     Comments: Speaking in full sentences without difficulty Musculoskeletal:     Cervical back: Normal range of motion and neck supple.  Skin:    General: Skin is warm and dry.     Comments: Skin thickening with maculopapular rash and hyperpigmentation to bilateral extensor wrist, L>R. Similar rash to the chest, forehead, BLE. No erythema, warmth,  tenderness.   Neurological:     Mental Status: She is alert and oriented to person, place, and time.      UC Treatments / Results  Labs (all labs ordered are listed, but only abnormal results are displayed) Labs Reviewed - No data to display  EKG   Radiology No results found.  Procedures Procedures (including critical care time)  Medications Ordered in UC Medications - No data to display  Initial Impression / Assessment and Plan / UC Course  I have reviewed the triage vital signs and the nursing notes.  Pertinent labs & imaging results that were available during my care of the patient were reviewed by me and considered in my medical decision making (see chart for details).    Triamcinolone cream refilled. Will also provide ointment at night for wrists. Desonide for face sparingly. Dermatology referral placed. Return precautions given.  Final Clinical Impressions(s) / UC Diagnoses   Final diagnoses:  Eczema, unspecified type    ED Prescriptions    Medication Sig Dispense Auth. Provider   triamcinolone cream (KENALOG) 0.1 % Apply 1 application topically 2 (two) times daily. 454 g Colleen Kotlarz V, PA-C   triamcinolone (KENALOG) 0.025 % ointment Apply 1 application topically 2 (two) times daily. 454 g Della Scrivener V, PA-C   desonide (DESOWEN) 0.05 % cream Apply topically 2 (two) times daily. Sparingly on the face if needed. Do not use more than 7 days in a row. 30 g Ok Edwards, PA-C     PDMP not reviewed this encounter.   Ok Edwards, PA-C 11/20/19 1506

## 2020-01-31 ENCOUNTER — Telehealth: Payer: Medicaid Other | Admitting: Family

## 2020-01-31 DIAGNOSIS — L209 Atopic dermatitis, unspecified: Secondary | ICD-10-CM

## 2020-01-31 MED ORDER — TRIAMCINOLONE ACETONIDE 0.5 % EX OINT
1.0000 | TOPICAL_OINTMENT | Freq: Two times a day (BID) | CUTANEOUS | 2 refills | Status: DC
Start: 2020-01-31 — End: 2020-02-02

## 2020-01-31 NOTE — Progress Notes (Signed)

## 2020-02-02 ENCOUNTER — Other Ambulatory Visit: Payer: Self-pay | Admitting: Family

## 2020-02-02 MED ORDER — TRIAMCINOLONE ACETONIDE 0.5 % EX CREA: 1.0000 "application " | TOPICAL_CREAM | Freq: Two times a day (BID) | CUTANEOUS | 2 refills | Status: DC

## 2020-03-08 ENCOUNTER — Telehealth: Payer: Medicaid Other | Admitting: Physician Assistant

## 2020-03-08 DIAGNOSIS — L2089 Other atopic dermatitis: Secondary | ICD-10-CM

## 2020-03-08 MED ORDER — PREDNISONE 10 MG PO TABS: ORAL_TABLET | ORAL | 0 refills | Status: DC

## 2020-03-08 NOTE — Progress Notes (Signed)
E Visit for Rash  We are sorry that you are not feeling well. Here is how we plan to help!  Based on what you shared with me it looks like you have atopic dermatitis.  You have been recently prescribed steroid cream and since no improvement on it, will prescribe oral steroids. If no improvement on the oral pills, please have a face to face evaluation for further management and possible alternate diagnosis.  Prednisone 10 mg daily for 6 days (see taper instructions below)  Directions for 6 day taper: Day 1: 2 tablets before breakfast, 1 after both lunch & dinner and 2 at bedtime Day 2: 1 tab before breakfast, 1 after both lunch & dinner and 2 at bedtime Day 3: 1 tab at each meal & 1 at bedtime Day 4: 1 tab at breakfast, 1 at lunch, 1 at bedtime Day 5: 1 tab at breakfast & 1 tab at bedtime Day 6: 1 tab at breakfast  HOME CARE:   Take cool showers and avoid direct sunlight.  Apply cool compress or wet dressings.  Take a bath in an oatmeal bath.  Sprinkle content of one Aveeno packet under running faucet with comfortably warm water.  Bathe for 15-20 minutes, 1-2 times daily.  Pat dry with a towel. Do not rub the rash.  Use hydrocortisone cream.  Take an antihistamine like Benadryl for widespread rashes that itch.  The adult dose of Benadryl is 25-50 mg by mouth 4 times daily.  Caution:  This type of medication may cause sleepiness.  Do not drink alcohol, drive, or operate dangerous machinery while taking antihistamines.  Do not take these medications if you have prostate enlargement.  Read package instructions thoroughly on all medications that you take.  GET HELP RIGHT AWAY IF:   Symptoms don't go away after treatment.  Severe itching that persists.  If you rash spreads or swells.  If you rash begins to smell.  If it blisters and opens or develops a yellow-brown crust.  You develop a fever.  You have a sore throat.  You become short of breath.  MAKE SURE  YOU:  Understand these instructions. Will watch your condition. Will get help right away if you are not doing well or get worse.  Thank you for choosing an e-visit. Your e-visit answers were reviewed by a board certified advanced clinical practitioner to complete your personal care plan. Depending upon the condition, your plan could have included both over the counter or prescription medications. Please review your pharmacy choice. Be sure that the pharmacy you have chosen is open so that you can pick up your prescription now.  If there is a problem you may message your provider in MyChart to have the prescription routed to another pharmacy. Your safety is important to [place]. If you have drug allergies check your prescription carefully.  For the next 24 hours, you can use MyChart to ask questions about today's visit, request a non-urgent call back, or ask for a work or school excuse from your e-visit provider. You will get an email in the next two days asking about your experience. I hope that your e-visit has been valuable and will speed your recovery.     I spent 5-10 minutes on review and completion of this note- Illa Level [facility]

## 2020-03-09 ENCOUNTER — Encounter: Payer: Self-pay | Admitting: Orthopedic Surgery

## 2020-03-13 ENCOUNTER — Other Ambulatory Visit: Payer: Self-pay | Admitting: Orthopedic Surgery

## 2020-03-14 ENCOUNTER — Other Ambulatory Visit: Payer: Self-pay | Admitting: Orthopedic Surgery

## 2020-05-24 ENCOUNTER — Telehealth: Payer: Medicaid Other | Admitting: Nurse Practitioner

## 2020-05-24 DIAGNOSIS — L309 Dermatitis, unspecified: Secondary | ICD-10-CM

## 2020-05-24 MED ORDER — TRIAMCINOLONE ACETONIDE 0.1 % EX CREA: 1.0000 "application " | TOPICAL_CREAM | Freq: Two times a day (BID) | CUTANEOUS | 0 refills | Status: DC

## 2020-05-24 MED ORDER — PREDNISONE 20 MG PO TABS: ORAL_TABLET | ORAL | 0 refills | Status: DC

## 2020-05-24 NOTE — Addendum Note (Signed)
Addended by: Bennie Pierini on: 05/24/2020 05:30 PM   Modules accepted: Orders

## 2020-05-24 NOTE — Progress Notes (Signed)
E-Visit for Eczema  We are sorry that you are not feeling well. Here is how we plan to help! Based on what you shared with me it looks like you have eczema (atopic dermatitis).  Although the cause of eczema is not completely understood, genetics appear to play a strong role, and people with a family history of eczema are at increased risk of developing the condition. In most people with eczema, there is a genetic abnormality in the outermost layer of the skin, called the epidermis   Most people with eczema develop their first symptoms as children, before the age of 30. Intense itching of the skin, patches of redness, small bumps, and skin flaking are common. Scratching can further inflame the skin and worsen the itching. The itchiness may be more noticeable at nighttime.  Eczema commonly affects the back of the neck, the elbow creases, and the backs of the knees. Other affected areas may include the face, wrists, and forearms. The skin may become thickened and darkened, or even scarred, from repeated scratching. Eliminating factors that aggravate your eczema symptoms can help to control the symptoms. Possible triggers may include: ? Cold or dry environments ? Sweating ? Emotional stress or anxiety ? Rapid temperature changes ? Exposure to certain chemicals or cleaning solutions, including soaps and detergents, perfumes and cosmetics, wool or synthetic fibers, dust, sand, and cigarette smoke Keeping your skin hydrated Emollients -- Emollients are creams and ointments that moisturize the skin and prevent it from drying out. The best emollients for people with eczema are thick creams (such as Eucerin, Cetaphil, and Nutraderm) or ointments (such as petroleum jelly, Aquaphor, and Vaseline), which contain little to no water. Emollients are most effective when applied immediately after bathing. Emollients can be applied twice a day or more often if needed. Lotions contain more water than creams and  ointments and are less effective for moisturizing the skin. Bathing -- It is not clear if showers or baths are better for keeping the skin hydrated. Lukewarm baths or showers can hydrate and cool the skin, temporarily relieving itching from eczema. An unscented, mild soap or non-soap cleanser (such as Cetaphil) should be used sparingly. Apply an emollient immediately after bathing or showering to prevent your skin from drying out as a result of water evaporation. Emollient bath additives (products you add to the bath water) have not been found to help relieve symptoms. Hot or long baths (more than 10 to 15 minutes) and showers should be avoided since they can dry out the skin.  Based on what you shared with me you may have eczema.   Triamcinalone ointment (or cream). Apply to the effected areas twice per day. and Prednisone 20 mg tablets. Take two daily by mouth for 5 days  Based on what you shared with me you may have a skin infection.     I recommend dilute bleach baths for people with eczema. These baths help to decrease the number of bacteria on the skin that can cause infections or worsen symptoms. To prepare a bleach bath, one-fourth to one-half cup of bleach is placed in a full bathtub (about 40 gallons) of water. Bleach baths are usually taken for 5 to 10 minutes twice per week and should be followed by application of an emollient (listed above). I recommend you take Benadryl 25mg  - 50mg  every 4 hours to control the symptoms (including itching) but if they last over 24 hours it is best that you see an office based provider for follow  up.  HOME CARE: . Take lukewarm showers or baths . Apply creams and ointments to prevent the skin from drying (Eucerin, Cetaphil, Nutraderm, petroleum jelly, Aquaphor or Vaseline) - these products contain less water than other lotions and are more effective for moisturizing the skin . Limit exposure to cold or dry environments, sweating, emotional stress and  anxiety, rapid temperature changes and exposure to chemicals and cleaning products, soaps and detergents, perfumes, cosmetics, wool and synthetic fibers, dust, sand and cigarette- factors which can aggravate eczema symptoms.  . Use a hydrocortisone cream once or twice a day . Take an antihistamine like Benadryl for widespread rashes that itch.  The adult dosage of Benadryl is 25-50 mg by mouth 4 times daily. Caution: This type of medication may cause sleepiness.  Do not drink alcohol, drive, or operate dangerous machinery while taking antihistamines.  Do not take these medications if you have prostate enlargement.  Read the package instructions thoroughly on all medications that you take.  GET HELP RIGHT AWAY IF: . Symptoms that don't go away after treatment. . Severe itching that persists. . You develop a fever. . Your skin begins to drain. . You have a sore throat. . You become short of breath.  MAKE SURE YOU    Understand these instructions.  Will watch your condition.  Will get help right away if you are not doing well or get worse.  Thank you for choosing an e-visit. Your e-visit answers were reviewed by a board certified advanced clinical practitioner to complete your personal care plan. Depending upon the condition, your plan could have included both over the counter or prescription medications. Please review your pharmacy choice. Make sure the pharmacy is open so you can pick up prescription now. If there is a problem, you may contact your provider through [employer] and have the prescription routed to another pharmacy. Your safety is important to [place]. If you have drug allergies check your prescription carefully.  For the next 24 hours you can use MyChart to ask questions about today's visit, request a non-urgent call back, or ask for a work or school excuse. You will get an email in the next two days asking about your experience. I hope that your e-visit has been valuable and  will speed your recovery    5-10 minutes spent reviewing and documenting in chart.

## 2020-09-26 ENCOUNTER — Other Ambulatory Visit: Payer: Self-pay

## 2020-09-26 ENCOUNTER — Telehealth: Payer: Medicaid Other | Admitting: Orthopedic Surgery

## 2020-09-26 DIAGNOSIS — R21 Rash and other nonspecific skin eruption: Secondary | ICD-10-CM

## 2020-09-26 MED ORDER — TRIAMCINOLONE ACETONIDE 0.1 % EX CREA: 1.0000 "application " | TOPICAL_CREAM | Freq: Two times a day (BID) | CUTANEOUS | 0 refills | Status: DC

## 2020-09-26 NOTE — Progress Notes (Signed)
E Visit for Rash  We are sorry that you are not feeling well. Here is how we plan to help!  I will prescribe the triamcinolone since that has seemed to work in the past.   HOME CARE:   Take cool showers and avoid direct sunlight.  Apply cool compress or wet dressings.  Take a bath in an oatmeal bath.  Sprinkle content of one Aveeno packet under running faucet with comfortably warm water.  Bathe for 15-20 minutes, 1-2 times daily.  Pat dry with a towel. Do not rub the rash.  Use hydrocortisone cream.  Take an antihistamine like Benadryl for widespread rashes that itch.  The adult dose of Benadryl is 25-50 mg by mouth 4 times daily.  Caution:  This type of medication may cause sleepiness.  Do not drink alcohol, drive, or operate dangerous machinery while taking antihistamines.  Do not take these medications if you have prostate enlargement.  Read package instructions thoroughly on all medications that you take.  GET HELP RIGHT AWAY IF:   Symptoms don't go away after treatment.  Severe itching that persists.  If you rash spreads or swells.  If you rash begins to smell.  If it blisters and opens or develops a yellow-brown crust.  You develop a fever.  You have a sore throat.  You become short of breath.  MAKE SURE YOU:  Understand these instructions. Will watch your condition. Will get help right away if you are not doing well or get worse.  Thank you for choosing an e-visit. Your e-visit answers were reviewed by a board certified advanced clinical practitioner to complete your personal care plan. Depending upon the condition, your plan could have included both over the counter or prescription medications. Please review your pharmacy choice. Be sure that the pharmacy you have chosen is open so that you can pick up your prescription now.  If there is a problem you may message your provider in MyChart to have the prescription routed to another pharmacy. Your safety is  important to [place]. If you have drug allergies check your prescription carefully.  For the next 24 hours, you can use MyChart to ask questions about today's visit, request a non-urgent call back, or ask for a work or school excuse from your e-visit provider. You will get an email in the next two days asking about your experience. I hope that your e-visit has been valuable and will speed your recovery.     Greater than 5 minutes, yet less than 10 minutes of time have been spent researching, coordinating and implementing care for this patient today.

## 2020-09-29 MED ORDER — TRIAMCINOLONE ACETONIDE 0.1 % EX CREA: 1.0000 "application " | TOPICAL_CREAM | Freq: Two times a day (BID) | CUTANEOUS | 0 refills | Status: DC

## 2020-09-29 NOTE — Addendum Note (Signed)
Addended by: Marco Collie on: 09/29/2020 08:24 AM   Modules accepted: Orders

## 2021-03-20 ENCOUNTER — Telehealth: Payer: Medicaid Other | Admitting: Physician Assistant

## 2021-03-20 DIAGNOSIS — L309 Dermatitis, unspecified: Secondary | ICD-10-CM

## 2021-03-20 DIAGNOSIS — L219 Seborrheic dermatitis, unspecified: Secondary | ICD-10-CM

## 2021-03-20 MED ORDER — TRIAMCINOLONE ACETONIDE 0.1 % EX CREA: 1.0000 "application " | TOPICAL_CREAM | Freq: Two times a day (BID) | CUTANEOUS | 0 refills | Status: DC

## 2021-03-20 NOTE — Progress Notes (Signed)
I have spent 5 minutes in review of e-visit questionnaire, review and updating patient chart, medical decision making and response to patient.   Dajsha Massaro Cody Katena Petitjean, PA-C    

## 2021-03-20 NOTE — Progress Notes (Signed)
E-Visit for Eczema  We are sorry that you are not feeling well. Here is how we plan to help! Based on what you shared with me it looks like you have eczema (atopic dermatitis).  Although the cause of eczema is not completely understood, genetics appear to play a strong role, and people with a family history of eczema are at increased risk of developing the condition. In most people with eczema, there is a genetic abnormality in the outermost layer of the skin, called the epidermis   Most people with eczema develop their first symptoms as children, before the age of 65. Intense itching of the skin, patches of redness, small bumps, and skin flaking are common. Scratching can further inflame the skin and worsen the itching. The itchiness may be more noticeable at nighttime.  Eczema commonly affects the back of the neck, the elbow creases, and the backs of the knees. Other affected areas may include the face, wrists, and forearms. The skin may become thickened and darkened, or even scarred, from repeated scratching. Eliminating factors that aggravate your eczema symptoms can help to control the symptoms. Possible triggers may include: ? Cold or dry environments ? Sweating ? Emotional stress or anxiety ? Rapid temperature changes ? Exposure to certain chemicals or cleaning solutions, including soaps and detergents, perfumes and cosmetics, wool or synthetic fibers, dust, sand, and cigarette smoke Keeping your skin hydrated Emollients -- Emollients are creams and ointments that moisturize the skin and prevent it from drying out. The best emollients for people with eczema are thick creams (such as Eucerin, Cetaphil, and Nutraderm) or ointments (such as petroleum jelly, Aquaphor, and Vaseline), which contain little to no water. Emollients are most effective when applied immediately after bathing. Emollients can be applied twice a day or more often if needed. Lotions contain more water than creams and  ointments and are less effective for moisturizing the skin. Bathing -- It is not clear if showers or baths are better for keeping the skin hydrated. Lukewarm baths or showers can hydrate and cool the skin, temporarily relieving itching from eczema. An unscented, mild soap or non-soap cleanser (such as Cetaphil) should be used sparingly. Apply an emollient immediately after bathing or showering to prevent your skin from drying out as a result of water evaporation. Emollient bath additives (products you add to the bath water) have not been found to help relieve symptoms. Hot or long baths (more than 10 to 15 minutes) and showers should be avoided since they can dry out the skin.  Based on what you shared with me you may have eczema.   I have prescribed: and Triamcinalone ointment (or cream). Apply to the effected areas twice per day. -- to areas on body and neck, not for use on face.   The areas on the face seem more consistent with a seborrheic dermatitis. Do not apply steroid to this area. I recommend you start use of an anti-dandruff shampoo on thiese areas once daily. Rubbing in and letting sit for 1 minute before washing off. For the areas on the eyelids, apply a baby shampoo like Laural Benes and [employer]. You can apply OTC strength steroid cream like hydrocortisone to the face no more than once daily for up to 7-10 days.   I recommend dilute bleach baths for people with eczema. These baths help to decrease the number of bacteria on the skin that can cause infections or worsen symptoms. To prepare a bleach bath, one-fourth to one-half cup of bleach is placed  in a full bathtub (about 40 gallons) of water. Bleach baths are usually taken for 5 to 10 minutes twice per week and should be followed by application of an emollient (listed above). I recommend you take Benadryl 25mg  - 50mg  every 4 hours to control the symptoms (including itching) but if they last over 24 hours it is best that you see an office based  provider for follow up.  HOME CARE: Take lukewarm showers or baths Apply creams and ointments to prevent the skin from drying (Eucerin, Cetaphil, Nutraderm, petroleum jelly, Aquaphor or Vaseline) - these products contain less water than other lotions and are more effective for moisturizing the skin Limit exposure to cold or dry environments, sweating, emotional stress and anxiety, rapid temperature changes and exposure to chemicals and cleaning products, soaps and detergents, perfumes, cosmetics, wool and synthetic fibers, dust, sand and cigarette- factors which can aggravate eczema symptoms.  Use a hydrocortisone cream once or twice a day Take an antihistamine like Benadryl for widespread rashes that itch.  The adult dosage of Benadryl is 25-50 mg by mouth 4 times daily. Caution: This type of medication may cause sleepiness.  Do not drink alcohol, drive, or operate dangerous machinery while taking antihistamines.  Do not take these medications if you have prostate enlargement.  Read the package instructions thoroughly on all medications that you take.  GET HELP RIGHT AWAY IF: Symptoms that don't go away after treatment. Severe itching that persists. You develop a fever. Your skin begins to drain. You have a sore throat. You become short of breath.  MAKE SURE YOU   Understand these instructions. Will watch your condition. Will get help right away if you are not doing well or get worse.    Thank you for choosing an e-visit.  Your e-visit answers were reviewed by a board certified advanced clinical practitioner to complete your personal care plan. Depending upon the condition, your plan could have included both over the counter or prescription medications.  Please review your pharmacy choice. Make sure the pharmacy is open so you can pick up prescription now. If there is a problem, you may contact your provider through and have the prescription routed to another pharmacy.   Your safety is important to . If you have drug allergies check your prescription carefully.   For the next 24 hours you can use MyChart to ask questions about today's visit, request a non-urgent call back, or ask for a work or school excuse. You will get an email in the next two days asking about your experience. I hope that your e-visit has been valuable and will speed your recovery.

## 2021-03-20 NOTE — Addendum Note (Signed)
Addended by: Waldon Merl on: 03/20/2021 04:44 PM   Modules accepted: Orders

## 2021-07-02 ENCOUNTER — Ambulatory Visit (HOSPITAL_COMMUNITY)
Admission: EM | Admit: 2021-07-02 | Discharge: 2021-07-02 | Disposition: A | Discharge status: Home / Self Care | Payer: Medicaid Other | Attending: Physician Assistant | Admitting: Physician Assistant

## 2021-07-02 ENCOUNTER — Encounter (HOSPITAL_COMMUNITY): Payer: Self-pay

## 2021-07-02 ENCOUNTER — Other Ambulatory Visit: Payer: Self-pay

## 2021-07-02 DIAGNOSIS — Z113 Encounter for screening for infections with a predominantly sexual mode of transmission: Secondary | ICD-10-CM | POA: Insufficient documentation

## 2021-07-02 DIAGNOSIS — N3000 Acute cystitis without hematuria: Secondary | ICD-10-CM | POA: Insufficient documentation

## 2021-07-02 DIAGNOSIS — R8290 Unspecified abnormal findings in urine: Secondary | ICD-10-CM | POA: Insufficient documentation

## 2021-07-02 DIAGNOSIS — R350 Frequency of micturition: Secondary | ICD-10-CM | POA: Insufficient documentation

## 2021-07-02 LAB — POCT URINALYSIS DIPSTICK, ED / UC
Bilirubin Urine: NEGATIVE
Glucose, UA: NEGATIVE mg/dL
Ketones, ur: NEGATIVE mg/dL
Nitrite: NEGATIVE
Protein, ur: 30 mg/dL — AB
Specific Gravity, Urine: 1.02 (ref 1.005–1.030)
Urobilinogen, UA: 1 mg/dL (ref 0.0–1.0)
pH: 7 (ref 5.0–8.0)

## 2021-07-02 LAB — HIV ANTIBODY (ROUTINE TESTING W REFLEX): HIV Screen 4th Generation wRfx: NONREACTIVE

## 2021-07-02 LAB — POC URINE PREG, ED: Preg Test, Ur: NEGATIVE

## 2021-07-02 LAB — HEPATITIS C ANTIBODY: HCV Ab: NONREACTIVE

## 2021-07-02 MED ORDER — SULFAMETHOXAZOLE-TRIMETHOPRIM 800-160 MG PO TABS
1.0000 | ORAL_TABLET | Freq: Two times a day (BID) | ORAL | 0 refills | Status: AC
Start: 2021-07-02 — End: 2021-07-09

## 2021-07-02 NOTE — ED Provider Notes (Signed)
[place]    CSN: [id number] Arrival date & time: 07/02/21  1232      History   Chief Complaint No chief complaint on file.   HPI Amber Lyons is a 26 y.o. female.   Patient presents today with a several week history of UTI symptoms.  She reports malodorous urine and urinary frequency.  Denies any pelvic pain, abdominal pain, vaginal symptoms, fever, dysuria, hematuria.  She denies any recent antibiotic use.  She does have a history of UTIs and states current symptoms are similar to previous episodes of this condition.  She has not seen a urologist in the past.  She denies history of nephrolithiasis, recent urogenital procedure, self-catheterization.  She is sexually active with female partners and does not consistently use condoms.  She is no specific concern for STI but is interested in STI panel including blood work for HIV/hepatitis/syphilis.   Past Medical History:  Diagnosis Date   Asthma    Eczema     There are no problems to display for this patient.   Past Surgical History:  Procedure Laterality Date   NO PAST SURGERIES      OB History     Gravida  0   Para  0   Term  0   Preterm  0   AB  0   Living  0      SAB  0   IAB  0   Ectopic  0   Multiple  0   Live Births  0            Home Medications    Prior to Admission medications   Medication Sig Start Date End Date Taking? Authorizing Provider  sulfamethoxazole-trimethoprim (BACTRIM DS) 800-160 MG tablet Take 1 tablet by mouth 2 (two) times daily for 7 days. 07/02/21 07/09/21 Yes Nysir Fergusson, Junie Panning K, PA-C  triamcinolone cream (KENALOG) 0.1 % Apply 1 application topically 2 (two) times daily. 03/20/21   Brunetta Jeans, PA-C  cetirizine (ZYRTEC) 10 MG tablet Take 1 tablet (10 mg total) by mouth daily. 10/11/19 11/20/19  Sharion Balloon, FNP  fluticasone (FLONASE) 50 MCG/ACT nasal spray Place 2 sprays into both nostrils daily. 10/11/19 11/20/19  Sharion Balloon, FNP  ranitidine (ZANTAC)  150 MG tablet Take 1 tablet (150 mg total) by mouth 2 (two) times daily for 5 days. Patient not taking: Reported on 08/04/2017 05/16/17 04/06/19  Duffy Bruce, MD    Family History Family History  Problem Relation Age of Onset   Asthma Mother    Asthma Father     Social History Social History   Tobacco Use   Smoking status: Former    Packs/day: 2.00    Types: Cigars, Cigarettes   Smokeless tobacco: Never  Vaping Use   Vaping Use: Never used  Substance Use Topics   Alcohol use: Yes    Comment: social   Drug use: Yes    Types: Marijuana    Comment: last use marijuana was 05-15-2017 at 7 pm.     Allergies   Penicillins   Review of Systems Review of Systems  Constitutional:  Positive for activity change. Negative for appetite change, fatigue and fever.  Respiratory:  Negative for cough and shortness of breath.   Cardiovascular:  Negative for chest pain.  Gastrointestinal:  Negative for abdominal pain, diarrhea, nausea and vomiting.  Genitourinary:  Positive for frequency. Negative for dysuria, pelvic pain, urgency, vaginal bleeding, vaginal discharge and vaginal pain.  Musculoskeletal:  Negative for arthralgias, back pain and myalgias.  Neurological:  Negative for dizziness, light-headedness and headaches.    Physical Exam Triage Vital Signs ED Triage Vitals  Enc Vitals Group     BP 07/02/21 1330 140/83     Pulse Rate 07/02/21 1330 78     Resp 07/02/21 1330 16     Temp 07/02/21 1330 98.3 F (36.8 C)     Temp src --      SpO2 07/02/21 1330 100 %     Weight --      Height --      Head Circumference --      Peak Flow --      Pain Score 07/02/21 1331 0     Pain Loc --      Pain Edu? --      Excl. in [place]? --    No data found.  Updated Vital Signs BP 140/83 (BP Location: Left Arm)    Pulse 78    Temp 98.3 F (36.8 C)    Resp 16    LMP 06/30/2021 (Approximate)    SpO2 100%   Visual Acuity Right Eye Distance:   Left Eye Distance:   Bilateral Distance:     Right Eye Near:   Left Eye Near:    Bilateral Near:     Physical Exam Vitals reviewed.  Constitutional:      General: She is awake. She is not in acute distress.    Appearance: Normal appearance. She is well-developed. She is not ill-appearing.     Comments: Very pleasant female present age no acute distress sitting comfortably in exam room  HENT:     Head: Normocephalic and atraumatic.  Cardiovascular:     Rate and Rhythm: Normal rate and regular rhythm.     Heart sounds: Normal heart sounds, S1 normal and S2 normal. No murmur heard. Pulmonary:     Effort: Pulmonary effort is normal.     Breath sounds: Normal breath sounds. No wheezing, rhonchi or rales.     Comments: Clear to auscultation bilaterally Abdominal:     General: Bowel sounds are normal.     Palpations: Abdomen is soft.     Tenderness: There is no abdominal tenderness. There is no right CVA tenderness, left CVA tenderness, guarding or rebound.     Comments: Benign abdominal exam  Genitourinary:    Comments: Exam deferred Psychiatric:        Behavior: Behavior is cooperative.     UC Treatments / Results  Labs (all labs ordered are listed, but only abnormal results are displayed) Labs Reviewed  POCT URINALYSIS DIPSTICK, ED / UC - Abnormal; Notable for the following components:      Result Value   Hgb urine dipstick TRACE (*)    Protein, ur 30 (*)    Leukocytes,Ua LARGE (*)    All other components within normal limits  URINE CULTURE  HIV ANTIBODY (ROUTINE TESTING W REFLEX)  RPR  HEPATITIS C ANTIBODY  POC URINE PREG, ED  CERVICOVAGINAL ANCILLARY ONLY    EKG   Radiology No results found.  Procedures Procedures (including critical care time)  Medications Ordered in UC Medications - No data to display  Initial Impression / Assessment and Plan / UC Course  I have reviewed the triage vital signs and the nursing notes.  Pertinent labs & imaging results that were available during my care of the  patient were reviewed by me and considered in my medical decision making (see chart  for details).     UA showed evidence of infection.  Given clinical presentation we will empirically treat with Bactrim DS given allergy to penicillins.  Discussed that if she has rash or oral lesions she should stop medication and be seen immediately.  STI swab and blood testing was collected today-results pending.  She was instructed to abstain from sex until results are obtained we discussed importance of safe sex practices.  Discussed alarm symptoms that warrant emergent evaluation including abdominal pain, fever, nausea, vomiting.  Discussed that she should have urine repeated in 1 to 2 weeks once UTI symptoms resolve to ensure that blood noted today resolves and is coming down with either our clinic or her primary care provider.  Strict return precautions given to which she expressed understanding.  Final Clinical Impressions(s) / UC Diagnoses   Final diagnoses:  Acute cystitis without hematuria  Malodorous urine  Urinary frequency  Routine screening for STI (sexually transmitted infection)     Discharge Instructions      Your urine did appear to have a urinary tract infection.  Start Bactrim DS twice daily for 7 days.  If you develop any rash or oral lesions you can just up on occasion to be seen immediately.  Make sure you drink plenty of fluid.  You should have your urine repeated in a few weeks to ensure that the blood noted today resolves.  We will contact you if your lab work is abnormal and you are positive for STI.  Please abstain from sex until you receive your results.  It is important use a condom with each sexual encounter.  If you develop any severe symptoms including fever, abdominal pain, nausea/vomiting you should be reevaluated immediately.     ED Prescriptions     Medication Sig Dispense Auth. Provider   sulfamethoxazole-trimethoprim (BACTRIM DS) 800-160 MG tablet Take 1 tablet by  mouth 2 (two) times daily for 7 days. 14 tablet Halston Fairclough, Derry Skill, PA-C      PDMP not reviewed this encounter.   Terrilee Croak, PA-C 07/02/21 1432

## 2021-07-02 NOTE — ED Triage Notes (Signed)
Pt presented to the office for STI testing with blood work. She thinks she may have UTI and would like for [place] to check her urine.

## 2021-07-02 NOTE — Discharge Instructions (Addendum)
Your urine did appear to have a urinary tract infection.  Start Bactrim DS twice daily for 7 days.  If you develop any rash or oral lesions you can just up on occasion to be seen immediately.  Make sure you drink plenty of fluid.  You should have your urine repeated in a few weeks to ensure that the blood noted today resolves.  We will contact you if your lab work is abnormal and you are positive for STI.  Please abstain from sex until you receive your results.  It is important use a condom with each sexual encounter.  If you develop any severe symptoms including fever, abdominal pain, nausea/vomiting you should be reevaluated immediately.

## 2021-07-03 ENCOUNTER — Telehealth (HOSPITAL_COMMUNITY): Payer: Self-pay | Admitting: Emergency Medicine

## 2021-07-03 LAB — CERVICOVAGINAL ANCILLARY ONLY
Bacterial Vaginitis (gardnerella): POSITIVE — AB
Candida Glabrata: NEGATIVE
Candida Vaginitis: POSITIVE — AB
Chlamydia: POSITIVE — AB
Comment: NEGATIVE
Comment: NEGATIVE
Comment: NEGATIVE
Comment: NEGATIVE
Comment: NEGATIVE
Comment: NORMAL
Neisseria Gonorrhea: NEGATIVE
Trichomonas: NEGATIVE

## 2021-07-03 LAB — RPR: RPR Ser Ql: NONREACTIVE

## 2021-07-03 MED ORDER — DOXYCYCLINE HYCLATE 100 MG PO CAPS: 100.0000 mg | ORAL_CAPSULE | Freq: Two times a day (BID) | ORAL | 0 refills | Status: AC

## 2021-07-03 MED ORDER — FLUCONAZOLE 150 MG PO TABS: 150.0000 mg | ORAL_TABLET | Freq: Once | ORAL | 0 refills | Status: AC

## 2021-07-03 MED ORDER — METRONIDAZOLE 0.75 % VA GEL: 1.0000 | Freq: Every day | VAGINAL | 0 refills | Status: AC

## 2021-07-04 LAB — URINE CULTURE: Culture: >=100000 — AB

## 2021-08-20 ENCOUNTER — Telehealth: Payer: Medicaid Other | Admitting: Physician Assistant

## 2021-08-20 DIAGNOSIS — R21 Rash and other nonspecific skin eruption: Secondary | ICD-10-CM

## 2021-08-20 MED ORDER — TRIAMCINOLONE ACETONIDE 0.1 % EX CREA: 1.0000 "application " | TOPICAL_CREAM | Freq: Two times a day (BID) | CUTANEOUS | 0 refills | Status: DC

## 2021-08-20 NOTE — Progress Notes (Signed)
E-Visit for Eczema ? ?We are sorry that you are not feeling well. Here is how we plan to help! ?Based on what you shared with me it looks like you have eczema (atopic dermatitis).  Although the cause of eczema is not completely understood, genetics appear to play a strong role, and people with a family history of eczema are at increased risk of developing the condition. In most people with eczema, there is a genetic abnormality in the outermost layer of the skin, called the epidermis  ? ?Most people with eczema develop their first symptoms as children, before the age of 57. Intense itching of the skin, patches of redness, small bumps, and skin flaking are common. Scratching can further inflame the skin and worsen the itching. The itchiness may be more noticeable at nighttime. ? ?Eczema commonly affects the back of the neck, the elbow creases, and the backs of the knees. Other affected areas may include the face, wrists, and forearms. The skin may become thickened and darkened, or even scarred, from repeated scratching. ?Eliminating factors that aggravate your eczema symptoms can help to control the symptoms. Possible triggers may include: ?? Cold or dry environments ?? Sweating ?? Emotional stress or anxiety ?? Rapid temperature changes ?? Exposure to certain chemicals or cleaning solutions, including soaps and detergents, perfumes and cosmetics, wool or synthetic fibers, dust, sand, and cigarette smoke ?Keeping your skin hydrated ?Emollients -- Emollients are creams and ointments that moisturize the skin and prevent it from drying out. The best emollients for people with eczema are thick creams (such as Eucerin, Cetaphil, and Nutraderm) or ointments (such as petroleum jelly, Aquaphor, and Vaseline), which contain little to no water. Emollients are most effective when applied immediately after bathing. Emollients can be applied twice a day or more often if needed. Lotions contain more water than creams and  ointments and are less effective for moisturizing the skin. ?Bathing -- It is not clear if showers or baths are better for keeping the skin hydrated. Lukewarm baths or showers can hydrate and cool the skin, temporarily relieving itching from eczema. An unscented, mild soap or non-soap cleanser (such as Cetaphil) should be used sparingly. Apply an emollient immediately after bathing or showering to prevent your skin from drying out as a result of water evaporation. Emollient bath additives (products you add to the bath water) have not been found to help relieve symptoms. ?Hot or long baths (more than 10 to 15 minutes) and showers should be avoided since they can dry out the skin. ? ?Based on what you shared with me you may have eczema.  ? ?Triamcinalone ointment (or cream). Apply to the effected areas twice per day. Do not use this on your face. You should use Vaseline on your face.  ? ?I recommend dilute bleach baths for people with eczema. These baths help to decrease the number of bacteria on the skin that can cause infections or worsen symptoms. To prepare a bleach bath, one-fourth to one-half cup of bleach is placed in a full bathtub (about 40 gallons) of water. Bleach baths are usually taken for 5 to 10 minutes twice per week and should be followed by application of an emollient (listed above). ?I recommend you take Benadryl 25mg  - 50mg  every 4 hours to control the symptoms (including itching) but if they last over 24 hours it is best that you see an office based provider for follow up. ? ?HOME CARE: ?Take lukewarm showers or baths ?Apply creams and ointments to prevent  the skin from drying (Eucerin, Cetaphil, Nutraderm, petroleum jelly, Aquaphor or Vaseline) - these products contain less water than other lotions and are more effective for moisturizing the skin ?Limit exposure to cold or dry environments, sweating, emotional stress and anxiety, rapid temperature changes and exposure to chemicals and cleaning  products, soaps and detergents, perfumes, cosmetics, wool and synthetic fibers, dust, sand and cigarette- factors which can aggravate eczema symptoms.  ?Use a hydrocortisone cream once or twice a day ?Take an antihistamine like Benadryl for widespread rashes that itch.  The adult dosage of Benadryl is 25-50 mg by mouth 4 times daily. ?Caution: This type of medication may cause sleepiness.  Do not drink alcohol, drive, or operate dangerous machinery while taking antihistamines.  Do not take these medications if you have prostate enlargement.  Read the package instructions thoroughly on all medications that you take.  ?GET HELP RIGHT AWAY IF: ?Symptoms that don't go away after treatment. ?Severe itching that persists. ?You develop a fever. ?Your skin begins to drain. ?You have a sore throat. ?You become short of breath. ? ?MAKE SURE YOU  ? ?Understand these instructions. ?Will watch your condition. ?Will get help right away if you are not doing well or get worse. ? ? ? ?Thank you for choosing an e-visit. ? ?Your e-visit answers were reviewed by a board certified advanced clinical practitioner to complete your personal care plan. Depending upon the condition, your plan could have included both over the counter or prescription medications. ? ?Please review your pharmacy choice. Make sure the pharmacy is open so you can pick up prescription now. If there is a problem, you may contact your provider through [employer] and have the prescription routed to another pharmacy.  Your safety is important to [place]. If you have drug allergies check your prescription carefully.  ? ?For the next 24 hours you can use MyChart to ask questions about today's visit, request a non-urgent call back, or ask for a work or school excuse. ?You will get an email in the next two days asking about your experience. I hope that your e-visit has been valuable and will speed your recovery. ? ?Approximately 5 minutes was spent documenting and  reviewing patient's chart. ? ?

## 2021-08-22 MED ORDER — TRIAMCINOLONE ACETONIDE 0.1 % EX CREA: 1.0000 "application " | TOPICAL_CREAM | Freq: Two times a day (BID) | CUTANEOUS | 1 refills | Status: DC

## 2021-08-22 NOTE — Addendum Note (Signed)
Addended by: Jannifer Rodney A on: 08/22/2021 07:30 AM ? ? Modules accepted: Orders ? ?

## 2022-02-26 ENCOUNTER — Telehealth: Payer: Medicaid Other | Admitting: Physician Assistant

## 2022-02-26 DIAGNOSIS — L309 Dermatitis, unspecified: Secondary | ICD-10-CM

## 2022-02-26 NOTE — Progress Notes (Signed)
The patient no-showed for appointment despite this provider sending direct link, reaching out via phone with no response and waiting for at least 10 minutes from appointment time for patient to join. They will be marked as a NS for this appointment/time.  ? ?Amber Nieland Cody Cliford Sequeira, PA-C ? ? ? ?

## 2022-02-26 NOTE — Progress Notes (Signed)
Because of severity of the eczema and pain it is causing, and because of some atypical characteristics of some of the areas on the hands, I feel your condition warrants further evaluation and I recommend that you be seen in a face to face visit.   NOTE: There will be NO CHARGE for this eVisit   If you are having a true medical emergency please call 911.      For an urgent face to face visit, Amber Lyons has seven urgent care centers for your convenience:     [facility] Center at [facility] Directions [phone] [address] [place], [place] [address]    [facility]) Get Driving Directions [phone] [address] [place], [place] [address]  [facility] Health Urgent Care Center [facility]) Get Driving Directions [phone] [address] [place],  [place]  [address]  [facility] Health Urgent Care Center [facility] - at [employer] Directions  [phone] [address] [place],  [place] [address]   [facility] Health Urgent Care at [facility] Get Driving Directions [phone] [address] [place], [place] [address]   [facility] Ur[facility] Care at [facility] Get Driving Directions  [phone] [address] [place], [place] [address]   [facility] Health Urgent Care at [facility] Directions [phone] [address], [address] [place], [place] [address]  Your MyChart E-visit questionnaire answers were reviewed by a board certified advanced clinical practitioner to complete your personal care plan based on your specific symptoms.  Thank you for using e-Visits.

## 2022-04-07 ENCOUNTER — Telehealth: Payer: Self-pay | Admitting: Physician Assistant

## 2022-04-07 DIAGNOSIS — L2084 Intrinsic (allergic) eczema: Secondary | ICD-10-CM

## 2022-04-07 MED ORDER — TRIAMCINOLONE ACETONIDE 0.1 % EX CREA: TOPICAL_CREAM | Freq: Two times a day (BID) | CUTANEOUS | 0 refills | Status: DC | PRN

## 2022-04-07 MED ORDER — PREDNISONE 20 MG PO TABS: 40.0000 mg | ORAL_TABLET | Freq: Every day | ORAL | 0 refills | Status: DC

## 2022-04-07 NOTE — Progress Notes (Signed)
Virtual Visit Consent   Amber Lyons, you are scheduled for a virtual visit with a [facility] provider today. Just as with appointments in the office, your consent must be obtained to participate. Your consent will be active for this visit and any virtual visit you may have with one of our providers in the next 365 days. If you have a MyChart account, a copy of this consent can be sent to you electronically.  As this is a virtual visit, video technology does not allow for your provider to perform a traditional examination. This may limit your provider's ability to fully assess your condition. If your provider identifies any concerns that need to be evaluated in person or the need to arrange testing (such as labs, EKG, etc.), we will make arrangements to do so. Although advances in technology are sophisticated, we cannot ensure that it will always work on either your end or our end. If the connection with a video visit is poor, the visit may have to be switched to a telephone visit. With either a video or telephone visit, we are not always able to ensure that we have a secure connection.  By engaging in this virtual visit, you consent to the provision of healthcare and authorize for your insurance to be billed (if applicable) for the services provided during this visit. Depending on your insurance coverage, you may receive a charge related to this service.  I need to obtain your verbal consent now. Are you willing to proceed with your visit today? Amber Lyons has provided verbal consent on 04/07/2022 for a virtual visit (video or telephone). Amber Lyons, [place]  Date: 04/07/2022 5:28 PM  Virtual Visit via Video Note   I, Amber Lyons, connected with  Amber Lyons  ([id number], 02-05-1996) on 04/07/22 at  5:00 PM EDT by a video-enabled telemedicine application and verified that I am speaking with the correct person using two identifiers.  Location: Patient: Virtual Visit Location  Patient: Home Provider: Virtual Visit Location Provider: Home Office   I discussed the limitations of evaluation and management by telemedicine and the availability of in person appointments. The patient expressed understanding and agreed to proceed.    History of Present Illness: Amber Lyons is a 26 y.o. who identifies as a female who was assigned female at birth, and is being seen today for flare of her chronic eczema. Notes having eczema since early childhood, predominantly of hands and neck. Was on triamcinolone as needed for mild flares but out of medication for past 2 months. Notes patches of her chest, feet and significant eczema of her fingers that she has not had in some time. Denies change to soaps, lotions or detergents. Unsure of cause of the current flare. Tries to use a good moisturizing cream daily to help as much as possible.  HPI: HPI  Problems: There are no problems to display for this patient.   Allergies:  Allergies  Allergen Reactions   Penicillins Hives and Swelling   Medications:  Current Outpatient Medications:    predniSONE (DELTASONE) 20 MG tablet, Take 2 tablets (40 mg total) by mouth daily with breakfast., Disp: 10 tablet, Rfl: 0   triamcinolone 0.1%-Eucerin equivalent 1:1 cream mixture, Apply topically 2 (two) times daily as needed., Disp: 480 g, Rfl: 0  Observations/Objective: Patient is well-developed, well-nourished in no acute distress.  Resting comfortably at home.  Head is normocephalic, atraumatic.  No labored breathing. Speech is clear and coherent with logical content.  Patient is alert and oriented at baseline.  Eczematous patches of neck and upper chest noted, significant eczematous lesions of hands especially first 3 digits bilaterally with R worse than LUE  Assessment and Plan: 1. Intrinsic eczema - predniSONE (DELTASONE) 20 MG tablet; Take 2 tablets (40 mg total) by mouth daily with breakfast.  Dispense: 10 tablet; Refill: 0 -  triamcinolone 0.1%-Eucerin equivalent 1:1 cream mixture; Apply topically 2 (two) times daily as needed.  Dispense: 480 g; Refill: 0  Giving severity, will give short course of oral steroid. Start Triamcinolone in Eucerin. Supportive measures and OTC medications reviewed. Information on [employer] [facility] given as she is without insurance at present.   Follow Up Instructions: I discussed the assessment and treatment plan with the patient. The patient was provided an opportunity to ask questions and all were answered. The patient agreed with the plan and demonstrated an understanding of the instructions.  A copy of instructions were sent to the patient via MyChart unless otherwise noted below.   The patient was advised to call back or seek an in-person evaluation if the symptoms worsen or if the condition fails to improve as anticipated.  Time:  I spent 10 minutes with the patient via telehealth technology discussing the above problems/concerns.    Amber Rio, PA-C

## 2022-04-07 NOTE — Patient Instructions (Signed)
Amber Lyons, thank you for joining Piedad Climes, PA-C for today's virtual visit.  While this provider is not your primary care provider (PCP), if your PCP is located in our provider database this encounter information will be shared with them immediately following your visit.   A [facility] MyChart account gives you access to today's visit and all your visits, tests, and labs performed at [facility] " click here if you don't have a [facility] MyChart account or go to [email or web address]  Consent: (Patient) Amber Lyons provided verbal consent for this virtual visit at the beginning of the encounter.  Current Medications:  Current Outpatient Medications:    predniSONE (DELTASONE) 20 MG tablet, Take 2 tablets (40 mg total) by mouth daily with breakfast., Disp: 10 tablet, Rfl: 0   triamcinolone 0.1%-Eucerin equivalent 1:1 cream mixture, Apply topically 2 (two) times daily as needed., Disp: 480 g, Rfl: 0   Medications ordered in this encounter:  Meds ordered this encounter  Medications   predniSONE (DELTASONE) 20 MG tablet    Sig: Take 2 tablets (40 mg total) by mouth daily with breakfast.    Dispense:  10 tablet    Refill:  0    Order Specific Question:   Supervising Provider    Answer:   Merrilee Jansky [[id number]]   triamcinolone 0.1%-Eucerin equivalent 1:1 cream mixture    Sig: Apply topically 2 (two) times daily as needed.    Dispense:  480 g    Refill:  0    Order Specific Question:   Supervising Provider    Answer:   Merrilee Jansky [id number]     *If you need refills on other medications prior to your next appointment, please contact your pharmacy*  Follow-Up: Call back or seek an in-person evaluation if the symptoms worsen or if the condition fails to improve as anticipated.  [facility] [phone]  Other Instructions [facility] -- [phone]  Eczema Eczema refers to a group of skin conditions  that cause skin to become rough and inflamed. Each type of eczema has different triggers, symptoms, and treatments. Eczema of any type is usually itchy. Symptoms range from mild to severe. Eczema is not spread from person to person (is not contagious). It can appear on different parts of the body at different times. One person's eczema may look different from another person's eczema. What are the causes? The exact cause of this condition is not known. However, exposure to certain environmental factors, irritants, and allergens can make the condition worse. What are the signs or symptoms? Symptoms of this condition depend on the type of eczema you have. The types include: Contact dermatitis. There are two kinds: Irritant contact dermatitis. This happens when something irritates the skin and causes a rash. Allergic contact dermatitis. This happens when your skin comes in contact with something you are allergic to (allergens). This can include poison ivy, chemicals, or medicines that were applied to your skin. Atopic dermatitis. This is a long-term (chronic) skin disease that keeps coming back (recurring). It is the most common type of eczema. Usual symptoms are a red rash and itchy, dry, scaly skin. It usually starts showing signs in infancy and can last through adulthood. Dyshidrotic eczema. This is a form of eczema on the hands and feet. It shows up as very itchy, fluid-filled blisters. It can affect people of any age but is more common before age 51. Hand eczema. This causes very itchy  areas of skin on the palms and sides of the hands and fingers. This type of eczema is common in industrial jobs where you may be exposed to different types of irritants. Lichen simplex chronicus. This type of eczema occurs when a person constantly scratches one area of the body. Repeated scratching of the area leads to thickened skin (lichenification). This condition can accompany other types of eczema. It is more common  in adults but may also be seen in children. Nummular eczema. This is a common type of eczema that most often affects the lower legs and the backs of the hands. It typically causes an itchy, red, circular, crusty lesion (plaque). Scratching may become a habit and can cause bleeding. Nummular eczema occurs most often in middle-aged or older people. Seborrheic dermatitis. This is a common skin disease that mainly affects the scalp. It may also affect other oily areas of the body, such as the face, sides of the nose, eyebrows, ears, eyelids, and chest. It is marked by small scaling and redness of the skin (erythema). This can affect people of all ages. In infants, this condition is called cradle cap. Stasis dermatitis. This is a common skin disease that can cause itching, scaling, and hyperpigmentation, usually on the legs and feet. It occurs most often in people who have a condition that prevents blood from being pumped through the veins in the legs (chronic venous insufficiency). Stasis dermatitis is a chronic condition that needs long-term management. How is this diagnosed? This condition may be diagnosed based on: A physical exam of your skin. Your medical history. Skin patch tests. These tests involve using patches that contain possible allergens and placing them on your back. Your health care provider will check in a few days to see if an allergic reaction occurred. How is this treated? Treatment for eczema is based on the type of eczema you have. You may be given hydrocortisone steroid medicine or antihistamines. These can relieve itching quickly and help reduce inflammation. These may be prescribed or purchased over the counter, depending on the strength that is needed. Follow these instructions at home: Take or apply over-the-counter and prescription medicines only as told by your health care provider. Use creams or ointments to moisturize your skin. Do not use lotions. Learn what triggers or  irritates your symptoms so you can avoid these things. Treat symptom flare-ups quickly. Do not scratch your skin. This can make your rash worse. Keep all follow-up visits. This is important. Where to find more information American Academy of Dermatology: MarketingSheets.si National Eczema Association: nationaleczema.org The Society for Pediatric Dermatology: pedsderm.net Contact a health care provider if: You have severe itching, even with treatment. You scratch your skin regularly until it bleeds. Your rash looks different than usual. Your skin is painful, swollen, or more red than usual. You have a fever. Summary Eczema refers to a group of skin conditions that cause skin to become rough and inflamed. Each type has different triggers. Eczema of any type causes itching that may range from mild to severe. Treatment varies based on the type of eczema you have. Hydrocortisone steroid medicine or antihistamines can help with itching and inflammation. Protecting your skin is the best way to prevent eczema. Use creams or ointments to moisturize your skin. Avoid triggers and irritants. Treat flare-ups quickly. This information is not intended to replace advice given to you by your health care provider. Make sure you discuss any questions you have with your health care provider. Document Revised: 03/11/2020 Document  Reviewed: 03/11/2020 Elsevier Patient Education  [place].    If you have been instructed to have an in-person evaluation today at a local Urgent Care facility, please use the link below. It will take you to a list of all of our available [facility], including address, phone number and hours of operation. Please do not delay care.  [facility]  If you or a family member do not have a primary care provider, use the link below to schedule a visit and establish care. When you choose a [facility] primary care physician or advanced practice provider, you gain a  long-term partner in health. Find a Primary Care Provider  Learn more about [facility]'s in-office and virtual care options: [facility]

## 2022-05-15 DIAGNOSIS — Z419 Encounter for procedure for purposes other than remedying health state, unspecified: Secondary | ICD-10-CM | POA: Diagnosis not present

## 2022-06-15 DIAGNOSIS — Z419 Encounter for procedure for purposes other than remedying health state, unspecified: Secondary | ICD-10-CM | POA: Diagnosis not present

## 2022-06-16 ENCOUNTER — Telehealth: Payer: Self-pay | Admitting: Physician Assistant

## 2022-06-16 DIAGNOSIS — J4521 Mild intermittent asthma with (acute) exacerbation: Secondary | ICD-10-CM

## 2022-06-16 MED ORDER — ALBUTEROL SULFATE HFA 108 (90 BASE) MCG/ACT IN AERS
2.0000 | INHALATION_SPRAY | Freq: Four times a day (QID) | RESPIRATORY_TRACT | 0 refills | Status: DC | PRN
Start: 2022-06-16 — End: 2023-04-14

## 2022-06-16 MED ORDER — PREDNISONE 20 MG PO TABS: 40.0000 mg | ORAL_TABLET | Freq: Every day | ORAL | 0 refills | Status: DC

## 2022-06-16 NOTE — Progress Notes (Signed)
I have spent 5 minutes in review of e-visit questionnaire, review and updating patient chart, medical decision making and response to patient.   Anthonymichael Munday Cody Tifanie Gardiner, PA-C    

## 2022-06-16 NOTE — Progress Notes (Signed)
Visit for Asthma  Based on what you have shared with me, it looks like you may have a flare up of your asthma.  Asthma is a chronic (ongoing) lung disease which results in airway obstruction, inflammation and hyper-responsiveness.   Asthma symptoms vary from person to person, with common symptoms including nighttime awakening and decreased ability to participate in normal activities as a result of shortness of breath. It is often triggered by changes in weather, changes in the season, changes in air temperature, or inside (home, school, daycare or work) allergens such as animal dander, mold, mildew, woodstoves or cockroaches.   It can also be triggered by hormonal changes, extreme emotion, physical exertion or an upper respiratory tract illness.     It is important to identify the trigger, and then eliminate or avoid the trigger if possible.   If you have been prescribed medications to be taken on a regular basis, it is important to follow the asthma action plan and to follow guidelines to adjust medication in response to increasing symptoms of decreased peak expiratory flow rate  Treatment: I have prescribed: Albuterol (Proventil HFA; Ventolin HFA) 108 (90 Base) MCG/ACT Inhaler 2 puffs into the lungs every six hours as needed for wheezing or shortness of breath and Prednisone 40mg by mouth per day for 5 - 7 days  HOME CARE Only take medications as instructed by your medical team. Consider wearing a mask or scarf to improve breathing air temperature have been shown to decrease irritation and decrease exacerbations Get rest. Taking a steamy shower or using a humidifier may help nasal congestion sand ease sore throat pain. You can place a towel over your head and breathe in the steam from hot water coming from a faucet. Using a saline nasal spray works much the same way.  Cough drops, hare  candies and sore throat lozenges may ease your cough.  Avoid close contacts especially the very you and the elderly Cover your mouth if you cough or sneeze Always remember to wash your hands.    GET HELP RIGHT AWAY IF: You develop worsening symptoms; breathlessness at rest, drowsy, confused or agitated, unable to speak in full sentences You have coughing fits You develop a severe headache or visual changes You develop shortness of breath, difficulty breathing or start having chest pain Your symptoms persist after you have completed your treatment plan If your symptoms do not improve within 10 days  MAKE SURE YOU Understand these instructions. Will watch your condition. Will get help right away if you are not doing well or get worse.   Your e-visit answers were reviewed by a board certified advanced clinical practitioner to complete your personal care plan, Depending upon the condition, your plan could have included both over the counter or prescription medications.   Please review your pharmacy choice. Your safety is important to us. If you have drug allergies check your prescription carefully.  You can use MyChart to ask questions about today's visit, request a non-urgent  call back, or ask for a work or school excuse for 24 hours related to this e-Visit. If it has been greater than 24 hours you will need to follow up with your provider, or enter a new e-Visit to address those concerns.   You will get an e-mail in the next two days asking about your experience. I hope that your e-visit has been valuable and will speed your recovery. Thank you for using e-visits.  

## 2022-06-30 ENCOUNTER — Telehealth: Payer: Self-pay

## 2022-06-30 ENCOUNTER — Telehealth: Payer: Medicaid Other | Admitting: Physician Assistant

## 2022-06-30 DIAGNOSIS — L298 Other pruritus: Secondary | ICD-10-CM

## 2022-06-30 NOTE — Progress Notes (Signed)
Because of severity of rash despite recent prednisone course (for asthma), I feel your condition warrants further evaluation and I recommend that you be seen in a face to face visit.   NOTE: There will be NO CHARGE for this eVisit   If you are having a true medical emergency please call 911.      For an urgent face to face visit, [facility] has eight urgent care centers for your convenience:   NEW!! [facility] [place] at [place] Get Driving Directions [phone] [address] [place], [place] [facility] [place] at [place] Get Driving Directions [phone] [place], [place] [address]   [place] [facility] [place] [facility]) Get Driving Directions [phone] [address], [place] [address]  [place] [facility] [place] ([place]) Get Driving Directions [phone] [address] [place],  [place]  [address]  [place] [facility] - at [address] [place] Get Driving Directions  [phone] [address] [place],  [place] [address]   [place] [facility] at [facility] Get Driving Directions [phone] [address] [place], [place], [place] [address]   [place] [facility] at [facility] Get Driving Directions  [phone] [address] [place], [place] [address]   [place] [facility] at [place] Get Driving Directions [phone] [address] [place], [place] [address]  Your MyChart E-visit questionnaire answers were reviewed by a board certified advanced clinical practitioner to complete your personal care plan based on your specific symptoms.  Thank you for using e-Visits.

## 2022-06-30 NOTE — Telephone Encounter (Signed)
Mychart msg sent. AS, CMA 

## 2022-07-14 ENCOUNTER — Encounter: Payer: Self-pay | Admitting: Physician Assistant

## 2022-07-14 ENCOUNTER — Other Ambulatory Visit: Payer: Self-pay

## 2022-07-14 ENCOUNTER — Ambulatory Visit
Admission: EM | Admit: 2022-07-14 | Discharge: 2022-07-14 | Disposition: A | Discharge status: Home / Self Care | Payer: Medicaid Other | Attending: Physician Assistant | Admitting: Physician Assistant

## 2022-07-14 DIAGNOSIS — Z113 Encounter for screening for infections with a predominantly sexual mode of transmission: Secondary | ICD-10-CM | POA: Insufficient documentation

## 2022-07-14 DIAGNOSIS — L309 Dermatitis, unspecified: Secondary | ICD-10-CM | POA: Diagnosis not present

## 2022-07-14 DIAGNOSIS — J4521 Mild intermittent asthma with (acute) exacerbation: Secondary | ICD-10-CM

## 2022-07-14 DIAGNOSIS — L2084 Intrinsic (allergic) eczema: Secondary | ICD-10-CM | POA: Diagnosis not present

## 2022-07-14 MED ORDER — PREDNISONE 20 MG PO TABS: 40.0000 mg | ORAL_TABLET | Freq: Every day | ORAL | 0 refills | Status: DC

## 2022-07-14 MED ORDER — TRIAMCINOLONE ACETONIDE 0.1 % EX CREA: TOPICAL_CREAM | Freq: Two times a day (BID) | CUTANEOUS | 0 refills | Status: DC | PRN

## 2022-07-14 MED ORDER — TRIAMCINOLONE ACETONIDE 0.1 % EX CREA: 1.0000 | TOPICAL_CREAM | Freq: Two times a day (BID) | CUTANEOUS | 0 refills | Status: DC

## 2022-07-14 NOTE — ED Provider Notes (Signed)
[facility]    CSN: [id number] Arrival date & time: 07/14/22  1754      History   Chief Complaint Chief Complaint  Patient presents with   Rash   SEXUALLY TRANSMITTED DISEASE    HPI Amber Lyons is a 27 y.o. female.   Patient here today for evaluation of rash over her body.  She states presentation is very similar to prior eczema flares.  She notes in the past she would have refills of medications and was able to keep up however due to lack of insurance she has been unable to get refills.  She states that rash is itchy.  She also would like STD screening.  She denies any discharge or other symptoms.  She does report some malodorous urine but denies any dysuria or other urinary symptoms.  The history is provided by the patient.    Past Medical History:  Diagnosis Date   Asthma    Eczema     There are no problems to display for this patient.   Past Surgical History:  Procedure Laterality Date   NO PAST SURGERIES      OB History     Gravida  0   Para  0   Term  0   Preterm  0   AB  0   Living  0      SAB  0   IAB  0   Ectopic  0   Multiple  0   Live Births  0            Home Medications    Prior to Admission medications   Medication Sig Start Date End Date Taking? Authorizing Provider  triamcinolone cream (KENALOG) 0.1 % Apply 1 Application topically 2 (two) times daily. 07/14/22  Yes Francene Finders, PA-C  albuterol (VENTOLIN HFA) 108 (90 Base) MCG/ACT inhaler Inhale 2 puffs into the lungs every 6 (six) hours as needed for wheezing or shortness of breath. 06/16/22   Brunetta Jeans, PA-C  predniSONE (DELTASONE) 20 MG tablet Take 2 tablets (40 mg total) by mouth daily with breakfast. 07/14/22   Francene Finders, PA-C  cetirizine (ZYRTEC) 10 MG tablet Take 1 tablet (10 mg total) by mouth daily. 10/11/19 11/20/19  Sharion Balloon, FNP  fluticasone (FLONASE) 50 MCG/ACT nasal spray Place 2 sprays into both nostrils daily. 10/11/19  11/20/19  Sharion Balloon, FNP  ranitidine (ZANTAC) 150 MG tablet Take 1 tablet (150 mg total) by mouth 2 (two) times daily for 5 days. Patient not taking: Reported on 08/04/2017 05/16/17 04/06/19  Duffy Bruce, MD    Family History Family History  Problem Relation Age of Onset   Asthma Mother    Asthma Father     Social History Social History   Tobacco Use   Smoking status: Former    Packs/day: 2.00    Types: Cigars, Cigarettes   Smokeless tobacco: Never  Vaping Use   Vaping Use: Never used  Substance Use Topics   Alcohol use: Yes    Comment: social   Drug use: Yes    Types: Marijuana    Comment: last use marijuana was 05-15-2017 at 7 pm.     Allergies   Penicillins   Review of Systems Review of Systems  Constitutional:  Negative for chills and fever.  Eyes:  Negative for discharge and redness.  Respiratory:  Negative for shortness of breath.   Gastrointestinal:  Negative for abdominal pain, nausea and vomiting.  Genitourinary:  Negative for dysuria, genital sores and vaginal discharge.  Skin:  Positive for rash.     Physical Exam Triage Vital Signs ED Triage Vitals  Enc Vitals Group     BP      Pulse      Resp      Temp      Temp src      SpO2      Weight      Height      Head Circumference      Peak Flow      Pain Score      Pain Loc      Pain Edu?      Excl. in [place]?    No data found.  Updated Vital Signs BP 112/74   Pulse 79   Temp 97.9 F (36.6 C)   Resp 20   LMP 07/08/2022   SpO2 97%      Physical Exam Vitals and nursing note reviewed.  Constitutional:      General: She is not in acute distress.    Appearance: Normal appearance. She is not ill-appearing.  HENT:     Head: Normocephalic and atraumatic.  Eyes:     Conjunctiva/sclera: Conjunctivae normal.  Cardiovascular:     Rate and Rhythm: Normal rate.  Pulmonary:     Effort: Pulmonary effort is normal. No respiratory distress.  Skin:    Comments: Dry scaling rash present  to chest, arms, legs, feet.  Most of rash is confluent to chest but more macular in presentation to lower legs and feet.  Neurological:     Mental Status: She is alert.  Psychiatric:        Mood and Affect: Mood normal.        Behavior: Behavior normal.        Thought Content: Thought content normal.      UC Treatments / Results  Labs (all labs ordered are listed, but only abnormal results are displayed) Labs Reviewed  HIV ANTIBODY (ROUTINE TESTING W REFLEX)  HEPATITIS PANEL, ACUTE  RPR  CERVICOVAGINAL ANCILLARY ONLY    EKG   Radiology No results found.  Procedures Procedures (including critical care time)  Medications Ordered in UC Medications - No data to display  Initial Impression / Assessment and Plan / UC Course  I have reviewed the triage vital signs and the nursing notes.  Pertinent labs & imaging results that were available during my care of the patient were reviewed by me and considered in my medical decision making (see chart for details).    Prednisone and triamcinolone cream prescribed for treatment of suspected eczema.  Recommended follow-up if no gradual improvement.  STD screening ordered.  Will await results further recommendation.  Encouraged follow-up if urinary symptoms do not improve.  Very low suspicion for UTI today given lack of dysuria or other concerning findings.  Final Clinical Impressions(s) / UC Diagnoses   Final diagnoses:  Screening for STD (sexually transmitted disease)  Eczema, unspecified type   Discharge Instructions   None    ED Prescriptions     Medication Sig Dispense Auth. Provider   predniSONE (DELTASONE) 20 MG tablet Take 2 tablets (40 mg total) by mouth daily with breakfast. 10 tablet Francene Finders, PA-C   triamcinolone 0.1%-Eucerin equivalent 1:1 cream mixture  (Status: Discontinued) Apply topically 2 (two) times daily as needed. 480 g Ewell Poe F, PA-C   triamcinolone cream (KENALOG) 0.1 % Apply 1  Application topically 2 (two)  times daily. 30 g Francene Finders, PA-C      PDMP not reviewed this encounter.   Francene Finders, PA-C 07/14/22 2000

## 2022-07-14 NOTE — ED Triage Notes (Signed)
Pt reports rash over entire body. Pt has rednes to skin and large blotches.Pt requested STD check on arrival to room.

## 2022-07-15 LAB — CERVICOVAGINAL ANCILLARY ONLY
Bacterial Vaginitis (gardnerella): POSITIVE — AB
Candida Glabrata: NEGATIVE
Candida Vaginitis: POSITIVE — AB
Chlamydia: NEGATIVE
Comment: NEGATIVE
Comment: NEGATIVE
Comment: NEGATIVE
Comment: NEGATIVE
Comment: NEGATIVE
Comment: NORMAL
Neisseria Gonorrhea: NEGATIVE
Trichomonas: NEGATIVE

## 2022-07-16 ENCOUNTER — Telehealth (HOSPITAL_COMMUNITY): Payer: Self-pay | Admitting: Emergency Medicine

## 2022-07-16 LAB — HIV ANTIBODY (ROUTINE TESTING W REFLEX): HIV Screen 4th Generation wRfx: NONREACTIVE

## 2022-07-16 LAB — RPR: RPR Ser Ql: NONREACTIVE

## 2022-07-16 MED ORDER — FLUCONAZOLE 150 MG PO TABS: 150.0000 mg | ORAL_TABLET | Freq: Once | ORAL | 0 refills | Status: AC

## 2022-07-16 MED ORDER — METRONIDAZOLE 500 MG PO TABS: 500.0000 mg | ORAL_TABLET | Freq: Two times a day (BID) | ORAL | 0 refills | Status: DC

## 2022-09-05 ENCOUNTER — Telehealth: Payer: Self-pay | Admitting: Nurse Practitioner

## 2022-09-05 DIAGNOSIS — L309 Dermatitis, unspecified: Secondary | ICD-10-CM

## 2022-09-05 MED ORDER — TRIAMCINOLONE ACETONIDE 0.1 % EX CREA: 1.0000 | TOPICAL_CREAM | Freq: Two times a day (BID) | CUTANEOUS | 0 refills | Status: DC

## 2022-09-05 MED ORDER — PREDNISONE 20 MG PO TABS: 40.0000 mg | ORAL_TABLET | Freq: Every day | ORAL | 0 refills | Status: AC

## 2022-09-05 NOTE — Addendum Note (Signed)
Addended by: Gracy Racer on: 09/05/2022 02:18 PM   Modules accepted: Orders

## 2022-09-05 NOTE — Progress Notes (Signed)

## 2022-09-05 NOTE — Progress Notes (Signed)
I have spent 5 minutes in review of e-visit questionnaire, review and updating patient chart, medical decision making and response to patient.  ° °Yocelin Vanlue W Jordi Lacko, NP ° °  °

## 2022-09-08 ENCOUNTER — Telehealth: Payer: Self-pay

## 2022-09-08 ENCOUNTER — Telehealth: Payer: Medicaid Other | Admitting: Physician Assistant

## 2022-09-08 DIAGNOSIS — L309 Dermatitis, unspecified: Secondary | ICD-10-CM

## 2022-09-08 NOTE — Progress Notes (Signed)
Because you were just treated for this flare a few days ago and are having such significant/painful symptoms, I feel your condition warrants further evaluation and I recommend that you be seen in a face to face visit.   NOTE: There will be NO CHARGE for this eVisit   If you are having a true medical emergency please call 911.      For an urgent face to face visit, [facility] has eight urgent care centers for your convenience:   NEW!! [place] [facility] [place] at [place] Get Driving Directions [phone] [address] [place], [place] [facility] [place] at [place] Get Driving Directions [id number] [place], [place] [address]   [place] [facility] [place] [facility]) Get Driving Directions [phone] [address], [place] [address]  [place] [facility] [place] ([place]) Get Driving Directions [id number] [address] [place],  [place]  [address]  [place] [facility] [place] [facility] - at [address] [place] Get Driving Directions  [phone] [address] [place],  [place] [address]   [facility] Get Driving Directions [id number] [place], [place], [place] [address]   [place] [facility] Get Driving Directions  [id number] [address] [place], [place] [address]   [place] [facility] at [place] Get Driving Directions [id number] [address] [place],  [address]  Your MyChart E-visit questionnaire answers were reviewed by a board certified advanced clinical practitioner to complete your personal care plan based on your specific symptoms.  Thank you for using e-Visits.

## 2022-09-12 ENCOUNTER — Telehealth: Payer: Self-pay | Admitting: Family Medicine

## 2022-09-12 DIAGNOSIS — L309 Dermatitis, unspecified: Secondary | ICD-10-CM

## 2022-09-12 MED ORDER — PREDNISONE 20 MG PO TABS: 20.0000 mg | ORAL_TABLET | Freq: Every day | ORAL | 0 refills | Status: AC

## 2022-09-12 MED ORDER — TRIAMCINOLONE ACETONIDE 0.1 % EX CREA: 1.0000 | TOPICAL_CREAM | Freq: Two times a day (BID) | CUTANEOUS | 0 refills | Status: DC

## 2022-09-12 NOTE — Progress Notes (Signed)
E-Visit for Eczema  We are sorry that you are not feeling well. Here is how we plan to help! Based on what you shared with me it looks like you have eczema (atopic dermatitis).  Although the cause of eczema is not completely understood, genetics appear to play a strong role, and people with a family history of eczema are at increased risk of developing the condition. In most people with eczema, there is a genetic abnormality in the outermost layer of the skin, called the epidermis   Most people with eczema develop their first symptoms as children, before the age of 9. Intense itching of the skin, patches of redness, small bumps, and skin flaking are common. Scratching can further inflame the skin and worsen the itching. The itchiness may be more noticeable at nighttime.  Eczema commonly affects the back of the neck, the elbow creases, and the backs of the knees. Other affected areas may include the face, wrists, and forearms. The skin may become thickened and darkened, or even scarred, from repeated scratching. Eliminating factors that aggravate your eczema symptoms can help to control the symptoms. Possible triggers may include: ? Cold or dry environments ? Sweating ? Emotional stress or anxiety ? Rapid temperature changes ? Exposure to certain chemicals or cleaning solutions, including soaps and detergents, perfumes and cosmetics, wool or synthetic fibers, dust, sand, and cigarette smoke Keeping your skin hydrated Emollients -- Emollients are creams and ointments that moisturize the skin and prevent it from drying out. The best emollients for people with eczema are thick creams (such as Eucerin, Cetaphil, and Nutraderm) or ointments (such as petroleum jelly, Aquaphor, and Vaseline), which contain little to no water. Emollients are most effective when applied immediately after bathing. Emollients can be applied twice a day or more often if needed. Lotions contain more water than creams and  ointments and are less effective for moisturizing the skin. Bathing -- It is not clear if showers or baths are better for keeping the skin hydrated. Lukewarm baths or showers can hydrate and cool the skin, temporarily relieving itching from eczema. An unscented, mild soap or non-soap cleanser (such as Cetaphil) should be used sparingly. Apply an emollient immediately after bathing or showering to prevent your skin from drying out as a result of water evaporation. Emollient bath additives (products you add to the bath water) have not been found to help relieve symptoms. Hot or long baths (more than 10 to 15 minutes) and showers should be avoided since they can dry out the skin.  Based on what you shared with me you may have eczema.   Triamcinalone ointment (or cream). Apply to the effected areas twice per day. and Prednisone 20 mg tablets. Take one daily by mouth for 5 days  Based on what you shared with me you may have a skin infection.      I recommend dilute bleach baths for people with eczema. These baths help to decrease the number of bacteria on the skin that can cause infections or worsen symptoms. To prepare a bleach bath, one-fourth to one-half cup of bleach is placed in a full bathtub (about 40 gallons) of water. Bleach baths are usually taken for 5 to 10 minutes twice per week and should be followed by application of an emollient (listed above). I recommend you take Benadryl 25mg  - 50mg  every 4 hours to control the symptoms (including itching) but if they last over 24 hours it is best that you see an office based provider for  follow up.  HOME CARE: Take lukewarm showers or baths Apply creams and ointments to prevent the skin from drying (Eucerin, Cetaphil, Nutraderm, petroleum jelly, Aquaphor or Vaseline) - these products contain less water than other lotions and are more effective for moisturizing the skin Limit exposure to cold or dry environments, sweating, emotional stress and  anxiety, rapid temperature changes and exposure to chemicals and cleaning products, soaps and detergents, perfumes, cosmetics, wool and synthetic fibers, dust, sand and cigarette- factors which can aggravate eczema symptoms.  Use a hydrocortisone cream once or twice a day Take an antihistamine like Benadryl for widespread rashes that itch.  The adult dosage of Benadryl is 25-50 mg by mouth 4 times daily. Caution: This type of medication may cause sleepiness.  Do not drink alcohol, drive, or operate dangerous machinery while taking antihistamines.  Do not take these medications if you have prostate enlargement.  Read the package instructions thoroughly on all medications that you take.  GET HELP RIGHT AWAY IF: Symptoms that don't go away after treatment. Severe itching that persists. You develop a fever. Your skin begins to drain. You have a sore throat. You become short of breath.  MAKE SURE YOU   Understand these instructions. Will watch your condition. Will get help right away if you are not doing well or get worse.    Thank you for choosing an e-visit.  Your e-visit answers were reviewed by a board certified advanced clinical practitioner to complete your personal care plan. Depending upon the condition, your plan could have included both over the counter or prescription medications.  Please review your pharmacy choice. Make sure the pharmacy is open so you can pick up prescription now. If there is a problem, you may contact your provider through [employer] and have the prescription routed to another pharmacy.  Your safety is important to [place]. If you have drug allergies check your prescription carefully.   For the next 24 hours you can use MyChart to ask questions about today's visit, request a non-urgent call back, or ask for a work or school excuse. You will get an email in the next two days asking about your experience. I hope that your e-visit has been valuable and will speed  your recovery.    have provided 5 minutes of non face to face time during this encounter for chart review and documentation.

## 2022-09-12 NOTE — Progress Notes (Signed)
Pt is a no show. Message answered earlier today. DWB

## 2022-09-28 ENCOUNTER — Telehealth: Payer: Self-pay | Admitting: Nurse Practitioner

## 2022-09-28 DIAGNOSIS — L309 Dermatitis, unspecified: Secondary | ICD-10-CM

## 2022-09-28 NOTE — Progress Notes (Signed)
Amber Lyons,  You need to be seen in person due to the chronic nature of your eczema. If you do not have a primary care there are instructions below, you may also try calling dermatology offices that may not require referral. While awaiting appointments with primary care or dermatology you can be seen at a local urgent care. Instructions for finding a primary care: [email or web address]  I feel your condition warrants further evaluation and I recommend that you be seen in a face to face visit.   NOTE: There will be NO CHARGE for this eVisit   If you are having a true medical emergency please call 911.      For an urgent face to face visit, [facility] has eight urgent care centers for your convenience:   NEW!! [facility] Health Urgent Care Center at [facility] Get Driving Directions [phone] [address] [place], [address]    [facility] Health Urgent Care Center at [facility] Get Driving Directions [phone] [address] [place], [place] [address]   [facility] Health Urgent Care Center [facility]) Get Driving Directions [phone] [address] [place], [place] [address]  [facility] Health Urgent Care Center [facility]) Get Driving Directions [phone] [address] [place],  [place]  [address]  [facility] Health Urgent Care Center [facility] - at [employer]  [phone] [address] [place],  [place] [address]   [facility] Health Urgent Care at [facility] Get Driving Directions [phone] [address] [place], [place] [address]   [facility] Health Urgent Care at [facility] Get Driving Directions  [phone] [address] [place], [place] [address]   [facility] Health Urgent Care at [facility] Directions [phone] [address] [place], [place] [address]  Your MyChart E-visit questionnaire  answers were reviewed by a board certified advanced clinical practitioner to complete your personal care plan based on your specific symptoms.  Thank you for using e-Visits.

## 2022-10-22 ENCOUNTER — Ambulatory Visit: Payer: Self-pay

## 2022-10-24 DIAGNOSIS — L308 Other specified dermatitis: Secondary | ICD-10-CM | POA: Diagnosis not present

## 2022-11-14 DIAGNOSIS — Z419 Encounter for procedure for purposes other than remedying health state, unspecified: Secondary | ICD-10-CM | POA: Diagnosis not present

## 2022-12-01 ENCOUNTER — Ambulatory Visit
Admission: EM | Admit: 2022-12-01 | Discharge: 2022-12-01 | Disposition: A | Discharge status: Home / Self Care | Payer: Medicaid Other | Attending: Urgent Care | Admitting: Urgent Care

## 2022-12-01 ENCOUNTER — Encounter: Payer: Self-pay | Admitting: Emergency Medicine

## 2022-12-01 DIAGNOSIS — L309 Dermatitis, unspecified: Secondary | ICD-10-CM

## 2022-12-01 MED ORDER — PREDNISONE 20 MG PO TABS: 40.0000 mg | ORAL_TABLET | Freq: Every day | ORAL | 0 refills | Status: AC

## 2022-12-01 MED ORDER — TRIAMCINOLONE ACETONIDE 0.1 % EX CREA: 1.0000 | TOPICAL_CREAM | Freq: Two times a day (BID) | CUTANEOUS | 0 refills | Status: DC

## 2022-12-01 NOTE — ED Provider Notes (Signed)
Renaldo Fiddler    CSN: [id number] Arrival date & time: 12/01/22  1504      History   Chief Complaint Chief Complaint  Patient presents with   Rash    HPI Amber Lyons is a 27 y.o. female.    Rash   Presents to urgent care with request for refill of triamcinolone cream and prednisone to treat eczema flare.  Past Medical History:  Diagnosis Date   Asthma    Eczema     There are no problems to display for this patient.   Past Surgical History:  Procedure Laterality Date   NO PAST SURGERIES      OB History     Gravida  0   Para  0   Term  0   Preterm  0   AB  0   Living  0      SAB  0   IAB  0   Ectopic  0   Multiple  0   Live Births  0            Home Medications    Prior to Admission medications   Medication Sig Start Date End Date Taking? Authorizing Provider  albuterol (VENTOLIN HFA) 108 (90 Base) MCG/ACT inhaler Inhale 2 puffs into the lungs every 6 (six) hours as needed for wheezing or shortness of breath. 06/16/22  Yes Waldon Merl, PA-C  triamcinolone cream (KENALOG) 0.1 % Apply 1 Application topically 2 (two) times daily. 09/05/22  Yes Claiborne Rigg, NP  metroNIDAZOLE (FLAGYL) 500 MG tablet Take 1 tablet (500 mg total) by mouth 2 (two) times daily. 07/16/22   LampteyBritta Mccreedy, MD  triamcinolone cream (KENALOG) 0.1 % Apply 1 Application topically 2 (two) times daily. 09/12/22   Delorse Lek, FNP  cetirizine (ZYRTEC) 10 MG tablet Take 1 tablet (10 mg total) by mouth daily. 10/11/19 11/20/19  Junie Spencer, FNP  fluticasone (FLONASE) 50 MCG/ACT nasal spray Place 2 sprays into both nostrils daily. 10/11/19 11/20/19  Junie Spencer, FNP  ranitidine (ZANTAC) 150 MG tablet Take 1 tablet (150 mg total) by mouth 2 (two) times daily for 5 days. Patient not taking: Reported on 08/04/2017 05/16/17 04/06/19  Shaune Pollack, MD    Family History Family History  Problem Relation Age of Onset   Asthma Mother    Asthma Father      Social History Social History   Tobacco Use   Smoking status: Former    Packs/day: 2    Types: Cigars, Cigarettes   Smokeless tobacco: Never  Vaping Use   Vaping Use: Never used  Substance Use Topics   Alcohol use: Yes    Comment: social   Drug use: Yes    Types: Marijuana    Comment: last use marijuana was 05-15-2017 at 7 pm.     Allergies   Penicillins   Review of Systems Review of Systems  Skin:  Positive for rash.     Physical Exam Triage Vital Signs ED Triage Vitals  Enc Vitals Group     BP 12/01/22 1510 121/77     Pulse Rate 12/01/22 1510 67     Resp 12/01/22 1510 16     Temp 12/01/22 1510 98.1 F (36.7 C)     Temp Source 12/01/22 1510 Oral     SpO2 12/01/22 1510 98 %     Weight --      Height --      Head Circumference --  Peak Flow --      Pain Score 12/01/22 1511 7     Pain Loc --      Pain Edu? --      Excl. in GC? --    No data found.  Updated Vital Signs BP 121/77 (BP Location: Left Arm)   Pulse 67   Temp 98.1 F (36.7 C) (Oral)   Resp 16   SpO2 98%   Visual Acuity Right Eye Distance:   Left Eye Distance:   Bilateral Distance:    Right Eye Near:   Left Eye Near:    Bilateral Near:     Physical Exam Vitals reviewed.  Constitutional:      Appearance: Normal appearance.  Skin:    General: Skin is warm and dry.     Findings: Rash present.  Neurological:     General: No focal deficit present.     Mental Status: She is alert and oriented to person, place, and time.  Psychiatric:        Mood and Affect: Mood normal.        Behavior: Behavior normal.      UC Treatments / Results  Labs (all labs ordered are listed, but only abnormal results are displayed) Labs Reviewed - No data to display  EKG   Radiology No results found.  Procedures Procedures (including critical care time)  Medications Ordered in UC Medications - No data to display  Initial Impression / Assessment and Plan / UC Course  I have  reviewed the triage vital signs and the nursing notes.  Pertinent labs & imaging results that were available during my care of the patient were reviewed by me and considered in my medical decision making (see chart for details).   Triamcinolone 0.1% represcribed as well as prednisone 40 mg x 5 days to treat eczema flare that has remained largely untreated.  Counseled patient on potential for adverse effects with medications prescribed/recommended today, ER and return-to-clinic precautions discussed, patient verbalized understanding and agreement with care plan.  Final Clinical Impressions(s) / UC Diagnoses   Final diagnoses:  None   Discharge Instructions   None    ED Prescriptions   None    PDMP not reviewed this encounter.   Charma Igo, [place] 12/01/22 1536

## 2022-12-01 NOTE — Discharge Instructions (Signed)
Follow up with dermatology.

## 2022-12-01 NOTE — ED Triage Notes (Signed)
Has struggled with eczema her whole life. States she's run out of her triamcinolone cream and needs more. PCP appointment next week for refills. Requesting prednisone and triamcinolone for her eczema flare until she can get in with her doc next week.

## 2022-12-14 DIAGNOSIS — Z419 Encounter for procedure for purposes other than remedying health state, unspecified: Secondary | ICD-10-CM | POA: Diagnosis not present

## 2023-01-01 ENCOUNTER — Telehealth: Payer: Medicaid Other | Admitting: Nurse Practitioner

## 2023-01-01 DIAGNOSIS — L308 Other specified dermatitis: Secondary | ICD-10-CM | POA: Diagnosis not present

## 2023-01-01 DIAGNOSIS — L309 Dermatitis, unspecified: Secondary | ICD-10-CM

## 2023-01-01 MED ORDER — PREDNISONE 20 MG PO TABS: 20.0000 mg | ORAL_TABLET | Freq: Two times a day (BID) | ORAL | 0 refills | Status: AC

## 2023-01-01 MED ORDER — TRIAMCINOLONE ACETONIDE 0.1 % EX CREA: 1.0000 | TOPICAL_CREAM | Freq: Two times a day (BID) | CUTANEOUS | 0 refills | Status: AC

## 2023-01-01 NOTE — Progress Notes (Signed)
Amber Lyons,  Thank you for submitting an e-visit Because your eczema has become chronic and is requiring monthly management we feel it is best you are seen in person, preferably by a dermatologist.   This  may require a referral from your primary care doctor, but you are also open to calling dermatology offices yourself, unfortunately we are not providing referrals at this time.   I feel your condition warrants further evaluation and I recommend that you be seen for a face to face visit.  Please contact your primary care physician practice to be seen. Many offices offer virtual options to be seen via video if you are not comfortable going in person to a medical facility at this time.  NOTE: You will NOT be charged for this eVisit.  If you do not have a PCP, [facility] offers a free physician referral service available at [phone]. Our trained staff has the experience, knowledge and resources to put you in touch with a physician who is right for you.    If you are having a true medical emergency please call 911.   Your e-visit answers were reviewed by a board certified advanced clinical practitioner to complete your personal care plan.  Thank you for using e-Visits.

## 2023-01-01 NOTE — Progress Notes (Signed)
Virtual Visit Consent   Amber Lyons, you are scheduled for a virtual visit with a [facility] provider today. Just as with appointments in the office, your consent must be obtained to participate. Your consent will be active for this visit and any virtual visit you may have with one of our providers in the next 365 days. If you have a MyChart account, a copy of this consent can be sent to you electronically.  As this is a virtual visit, video technology does not allow for your provider to perform a traditional examination. This may limit your provider's ability to fully assess your condition. If your provider identifies any concerns that need to be evaluated in person or the need to arrange testing (such as labs, EKG, etc.), we will make arrangements to do so. Although advances in technology are sophisticated, we cannot ensure that it will always work on either your end or our end. If the connection with a video visit is poor, the visit may have to be switched to a telephone visit. With either a video or telephone visit, we are not always able to ensure that we have a secure connection.  By engaging in this virtual visit, you consent to the provision of healthcare and authorize for your insurance to be billed (if applicable) for the services provided during this visit. Depending on your insurance coverage, you may receive a charge related to this service.  I need to obtain your verbal consent now. Are you willing to proceed with your visit today? Amber Lyons has provided verbal consent on 01/01/2023 for a virtual visit (video or telephone). Amber Simas, FNP  Date: 01/01/2023 5:28 PM  Virtual Visit via Video Note   I, Amber Lyons, connected with  Amber Lyons  ([id number], 05/22/1996) on 01/01/23 at  5:30 PM EDT by a video-enabled telemedicine application and verified that I am speaking with the correct person using two identifiers.  Location: Patient: Virtual Visit Location Patient:  Home Provider: Virtual Visit Location Provider: Home Office   I discussed the limitations of evaluation and management by telemedicine and the availability of in person appointments. The patient expressed understanding and agreed to proceed.    History of Present Illness: Amber Lyons is a 27 y.o. who identifies as a female who was assigned female at birth, and is being seen today for ongoing chronic severe eczema  She has been using triamcinolone cream for each outbreak   Most recent treatment was at [facility] on 12/01/22 Triamcinolone 0.1% represcribed as well as oral prednisone 40 mg x 5 days   She had prior appointments with e-visits dating back to 2021 (13 in total)   Treatments have involved topical steroids and oral steroids   She does not have a primary care doctor  She has not had a PCP since she was 76   Recently got insurance established and is looking to get established     Allergies:  Allergies  Allergen Reactions   Penicillins Hives and Swelling   Medications:  Current Outpatient Medications:    albuterol (VENTOLIN HFA) 108 (90 Base) MCG/ACT inhaler, Inhale 2 puffs into the lungs every 6 (six) hours as needed for wheezing or shortness of breath., Disp: 8 g, Rfl: 0   metroNIDAZOLE (FLAGYL) 500 MG tablet, Take 1 tablet (500 mg total) by mouth 2 (two) times daily., Disp: 14 tablet, Rfl: 0   triamcinolone cream (KENALOG) 0.1 %, Apply 1 Application topically 2 (two) times daily., Disp: 454 g,  Rfl: 0  Observations/Objective: Patient is well-developed, well-nourished in no acute distress.  Resting comfortably  at home.  Head is normocephalic, atraumatic.  No labored breathing.  Speech is clear and coherent with logical content.  Patient is alert and oriented at baseline.  Dry erythematous patches throughout torso, extremities and neck  No open wounds   Assessment and Plan: 1. Other eczema  Meds ordered this encounter  Medications   triamcinolone cream (KENALOG) 0.1 %     Sig: Apply 1 Application topically 2 (two) times daily for 14 days.    Dispense:  80 g    Refill:  0   predniSONE (DELTASONE) 20 MG tablet    Sig: Take 1 tablet (20 mg total) by mouth 2 (two) times daily with a meal for 5 days.    Dispense:  10 tablet    Refill:  0    Helped patient get scheduled with a primary care doctor next week  Will provide one time refill but emphasized she needs in person evaluation and referral to dermatology possibly for chronic severe eczema   Also discussed long term topical use of steroids   Discussed moisturizers to use   Follow Up Instructions: I discussed the assessment and treatment plan with the patient. The patient was provided an opportunity to ask questions and all were answered. The patient agreed with the plan and demonstrated an understanding of the instructions.  A copy of instructions were sent to the patient via MyChart unless otherwise noted below.    The patient was advised to call back or seek an in-person evaluation if the symptoms worsen or if the condition fails to improve as anticipated.  Time:  I spent 11 minutes with the patient via telehealth technology discussing the above problems/concerns.    Amber Simas, FNP

## 2023-01-01 NOTE — Patient Instructions (Signed)
  New Patient Visit  Salvatore Decent Wednesday January 06, 2023 Arrive by 9:25 AM EDT Starts at 9:40 AM EDT (20 minutes)  [facility] at [facility] [address] [place] [address] ?[phone]?

## 2023-01-06 ENCOUNTER — Ambulatory Visit: Payer: Medicaid Other | Admitting: Internal Medicine

## 2023-01-14 DIAGNOSIS — Z419 Encounter for procedure for purposes other than remedying health state, unspecified: Secondary | ICD-10-CM | POA: Diagnosis not present

## 2023-02-14 DIAGNOSIS — Z419 Encounter for procedure for purposes other than remedying health state, unspecified: Secondary | ICD-10-CM | POA: Diagnosis not present

## 2023-02-19 ENCOUNTER — Telehealth: Payer: Medicaid Other | Admitting: Nurse Practitioner

## 2023-02-19 DIAGNOSIS — L309 Dermatitis, unspecified: Secondary | ICD-10-CM

## 2023-02-19 MED ORDER — TRIAMCINOLONE ACETONIDE 0.1 % EX CREA: 1.0000 | TOPICAL_CREAM | Freq: Two times a day (BID) | CUTANEOUS | 1 refills | Status: DC

## 2023-02-19 MED ORDER — PREDNISONE 20 MG PO TABS: 20.0000 mg | ORAL_TABLET | Freq: Two times a day (BID) | ORAL | 0 refills | Status: AC

## 2023-02-19 NOTE — Progress Notes (Signed)
Amber Lyons to see you have an appointment in October, please be sure to make it to that appointment as we will not be able to refill this medication after today's visit as we have previously discussed.    Based on what you shared with me it looks like you have eczema (atopic dermatitis).  Although the cause of eczema is not completely understood, genetics appear to play a strong role, and people with a family history of eczema are at increased risk of developing the condition. In most people with eczema, there is a genetic abnormality in the outermost layer of the skin, called the epidermis   Most people with eczema develop their first symptoms as children, before the age of 33. Intense itching of the skin, patches of redness, small bumps, and skin flaking are common. Scratching can further inflame the skin and worsen the itching. The itchiness may be more noticeable at nighttime.  Eczema commonly affects the back of the neck, the elbow creases, and the backs of the knees. Other affected areas may include the face, wrists, and forearms. The skin may become thickened and darkened, or even scarred, from repeated scratching. Eliminating factors that aggravate your eczema symptoms can help to control the symptoms. Possible triggers may include: ? Cold or dry environments ? Sweating ? Emotional stress or anxiety ? Rapid temperature changes ? Exposure to certain chemicals or cleaning solutions, including soaps and detergents, perfumes and cosmetics, wool or synthetic fibers, dust, sand, and cigarette smoke Keeping your skin hydrated Emollients -- Emollients are creams and ointments that moisturize the skin and prevent it from drying out. The best emollients for people with eczema are thick creams (such as Eucerin, Cetaphil, and Nutraderm) or ointments (such as petroleum jelly, Aquaphor, and Vaseline), which contain little to no water. Emollients are most effective when applied immediately after  bathing. Emollients can be applied twice a day or more often if needed. Lotions contain more water than creams and ointments and are less effective for moisturizing the skin. Bathing -- It is not clear if showers or baths are better for keeping the skin hydrated. Lukewarm baths or showers can hydrate and cool the skin, temporarily relieving itching from eczema. An unscented, mild soap or non-soap cleanser (such as Cetaphil) should be used sparingly. Apply an emollient immediately after bathing or showering to prevent your skin from drying out as a result of water evaporation. Emollient bath additives (products you add to the bath water) have not been found to help relieve symptoms. Hot or long baths (more than 10 to 15 minutes) and showers should be avoided since they can dry out the skin.  Based on what you shared with me you may have eczema.   Triamcinalone ointment (or cream). Apply to the effected areas twice per day.    I recommend dilute bleach baths for people with eczema. These baths help to decrease the number of bacteria on the skin that can cause infections or worsen symptoms. To prepare a bleach bath, one-fourth to one-half cup of bleach is placed in a full bathtub (about 40 gallons) of water. Bleach baths are usually taken for 5 to 10 minutes twice per week and should be followed by application of an emollient (listed above). I recommend you take Benadryl 25mg  - 50mg  every 4 hours to control the symptoms (including itching) but if they last over 24 hours it is best that you see an office based provider for follow up.  HOME CARE: Take lukewarm showers  or baths Apply creams and ointments to prevent the skin from drying (Eucerin, Cetaphil, Nutraderm, petroleum jelly, Aquaphor or Vaseline) - these products contain less water than other lotions and are more effective for moisturizing the skin Limit exposure to cold or dry environments, sweating, emotional stress and anxiety, rapid temperature  changes and exposure to chemicals and cleaning products, soaps and detergents, perfumes, cosmetics, wool and synthetic fibers, dust, sand and cigarette- factors which can aggravate eczema symptoms.  Use a hydrocortisone cream once or twice a day Take an antihistamine like Benadryl for widespread rashes that itch.  The adult dosage of Benadryl is 25-50 mg by mouth 4 times daily. Caution: This type of medication may cause sleepiness.  Do not drink alcohol, drive, or operate dangerous machinery while taking antihistamines.  Do not take these medications if you have prostate enlargement.  Read the package instructions thoroughly on all medications that you take.  GET HELP RIGHT AWAY IF: Symptoms that don't go away after treatment. Severe itching that persists. You develop a fever. Your skin begins to drain. You have a sore throat. You become short of breath.  MAKE SURE YOU   Understand these instructions. Will watch your condition. Will get help right away if you are not doing well or get worse.    Thank you for choosing an e-visit.  Your e-visit answers were reviewed by a board certified advanced clinical practitioner to complete your personal care plan. Depending upon the condition, your plan could have included both over the counter or prescription medications.  Please review your pharmacy choice. Make sure the pharmacy is open so you can pick up prescription now. If there is a problem, you may contact your provider through [employer] and have the prescription routed to another pharmacy.  Your safety is important to [place]. If you have drug allergies check your prescription carefully.   For the next 24 hours you can use MyChart to ask questions about today's visit, request a non-urgent call back, or ask for a work or school excuse. You will get an email in the next two days asking about your experience. I hope that your e-visit has been valuable and will speed your recovery.   Meds  ordered this encounter  Medications   triamcinolone cream (KENALOG) 0.1 %    Sig: Apply 1 Application topically 2 (two) times daily.    Dispense:  45 g    Refill:  1    I spent approximately 5 minutes reviewing the patient's history, current symptoms and coordinating their care today.

## 2023-02-19 NOTE — Addendum Note (Signed)
Addended by: Harlow Mares on: 02/19/2023 09:48 AM   Modules accepted: Orders

## 2023-03-10 ENCOUNTER — Encounter: Payer: Self-pay | Admitting: Emergency Medicine

## 2023-03-10 ENCOUNTER — Telehealth: Payer: Medicaid Other | Admitting: Physician Assistant

## 2023-03-10 ENCOUNTER — Ambulatory Visit
Admission: EM | Admit: 2023-03-10 | Discharge: 2023-03-10 | Disposition: A | Discharge status: Home / Self Care | Payer: Medicaid Other | Attending: Internal Medicine | Admitting: Internal Medicine

## 2023-03-10 DIAGNOSIS — L309 Dermatitis, unspecified: Secondary | ICD-10-CM | POA: Diagnosis not present

## 2023-03-10 DIAGNOSIS — R21 Rash and other nonspecific skin eruption: Secondary | ICD-10-CM

## 2023-03-10 MED ORDER — PREDNISONE 10 MG (21) PO TBPK: ORAL_TABLET | Freq: Every day | ORAL | 0 refills | Status: DC

## 2023-03-10 MED ORDER — TRIAMCINOLONE ACETONIDE 0.1 % EX CREA: 1.0000 | TOPICAL_CREAM | Freq: Two times a day (BID) | CUTANEOUS | 0 refills | Status: DC

## 2023-03-10 NOTE — ED Provider Notes (Signed)
[facility]    CSN: [id number] Arrival date & time: 03/10/23  1036      History   Chief Complaint Chief Complaint  Patient presents with   Rash    HPI Amber Lyons is a 27 y.o. female.   Patient presents today given concern of eczema flareup to majority of her body including chest, arms, legs, face, back.  Patient has been seen multiple times by video visit and urgent care over the past few months and repeatedly prescribed prednisone and given refills of triamcinolone.  Reports that these medications seem to be helpful but then it returns.  This flareup currently started about 3 to 4 days ago.  She has never seen dermatology for this.   Rash   Past Medical History:  Diagnosis Date   Asthma    Eczema     There are no problems to display for this patient.   Past Surgical History:  Procedure Laterality Date   NO PAST SURGERIES      OB History     Gravida  0   Para  0   Term  0   Preterm  0   AB  0   Living  0      SAB  0   IAB  0   Ectopic  0   Multiple  0   Live Births  0            Home Medications    Prior to Admission medications   Medication Sig Start Date End Date Taking? Authorizing Provider  predniSONE (STERAPRED UNI-PAK 21 TAB) 10 MG (21) TBPK tablet Take by mouth daily. Take 6 tabs by mouth daily  for 2 days, then 5 tabs for 2 days, then 4 tabs for 2 days, then 3 tabs for 2 days, 2 tabs for 2 days, then 1 tab by mouth daily for 2 days 03/10/23  Yes Dondrea Clendenin, Rolly Salter E, FNP  albuterol (VENTOLIN HFA) 108 (90 Base) MCG/ACT inhaler Inhale 2 puffs into the lungs every 6 (six) hours as needed for wheezing or shortness of breath. 06/16/22   Waldon Merl, PA-C  metroNIDAZOLE (FLAGYL) 500 MG tablet Take 1 tablet (500 mg total) by mouth 2 (two) times daily. 07/16/22   LampteyBritta Mccreedy, MD  triamcinolone cream (KENALOG) 0.1 % Apply 1 Application topically 2 (two) times daily. 03/10/23   Gustavus Bryant, FNP  cetirizine (ZYRTEC) 10 MG  tablet Take 1 tablet (10 mg total) by mouth daily. 10/11/19 11/20/19  Junie Spencer, FNP  fluticasone (FLONASE) 50 MCG/ACT nasal spray Place 2 sprays into both nostrils daily. 10/11/19 11/20/19  Junie Spencer, FNP  ranitidine (ZANTAC) 150 MG tablet Take 1 tablet (150 mg total) by mouth 2 (two) times daily for 5 days. Patient not taking: Reported on 08/04/2017 05/16/17 04/06/19  Shaune Pollack, MD    Family History Family History  Problem Relation Age of Onset   Asthma Mother    Asthma Father     Social History Social History   Tobacco Use   Smoking status: Former    Current packs/day: 2.00    Types: Cigars, Cigarettes   Smokeless tobacco: Never  Vaping Use   Vaping status: Never Used  Substance Use Topics   Alcohol use: Yes    Comment: social   Drug use: Yes    Types: Marijuana    Comment: last use marijuana was 05-15-2017 at 7 pm.     Allergies   Penicillins  Review of Systems Review of Systems Per HPI  Physical Exam Triage Vital Signs ED Triage Vitals  Encounter Vitals Group     BP 03/10/23 1155 (!) 151/86     Systolic BP Percentile --      Diastolic BP Percentile --      Pulse Rate 03/10/23 1155 71     Resp 03/10/23 1155 16     Temp 03/10/23 1155 97.9 F (36.6 C)     Temp Source 03/10/23 1155 Oral     SpO2 03/10/23 1155 97 %     Weight 03/10/23 1158 226 lb (102.5 kg)     Height 03/10/23 1158 5\' 6"  (1.676 m)     Head Circumference --      Peak Flow --      Pain Score 03/10/23 1158 10     Pain Loc --      Pain Education --      Exclude from Growth Chart --    No data found.  Updated Vital Signs BP (!) 151/86 (BP Location: Left Arm)   Pulse 71   Temp 97.9 F (36.6 C) (Oral)   Resp 16   Ht 5\' 6"  (1.676 m)   Wt 226 lb (102.5 kg)   LMP 03/04/2023   SpO2 97%   BMI 36.48 kg/m   Visual Acuity Right Eye Distance:   Left Eye Distance:   Bilateral Distance:    Right Eye Near:   Left Eye Near:    Bilateral Near:     Physical  Exam Constitutional:      General: She is not in acute distress.    Appearance: Normal appearance. She is not toxic-appearing or diaphoretic.  HENT:     Head: Normocephalic and atraumatic.  Eyes:     Extraocular Movements: Extraocular movements intact.     Conjunctiva/sclera: Conjunctivae normal.  Pulmonary:     Effort: Pulmonary effort is normal.  Skin:    Comments: Has significant flat, erythematous mildly scaly rash present to bilateral distal forearms, entirety of upper chest, upper back, left lower extremity.  Rash also is circumferential around mouth.  Neurological:     General: No focal deficit present.     Mental Status: She is alert and oriented to person, place, and time. Mental status is at baseline.  Psychiatric:        Mood and Affect: Mood normal.        Behavior: Behavior normal.        Thought Content: Thought content normal.        Judgment: Judgment normal.      UC Treatments / Results  Labs (all labs ordered are listed, but only abnormal results are displayed) Labs Reviewed - No data to display  EKG   Radiology No results found.  Procedures Procedures (including critical care time)  Medications Ordered in UC Medications - No data to display  Initial Impression / Assessment and Plan / UC Course  I have reviewed the triage vital signs and the nursing notes.  Pertinent labs & imaging results that were available during my care of the patient were reviewed by me and considered in my medical decision making (see chart for details).     Patient has been on prednisone steroid burst almost monthly since January with last dose being on 02/19/2023.  Discussed patient's clinical course and management with colleague MD who is concerned that patient could be having rebound symptoms from steroid therapy which I am agreeable with.  Although, given  severity of patient's rash, there are limited options on treatment at this time.  Therefore, will prescribe prednisone  steroid taper.  Will refill triamcinolone but advised patient to use this sparingly as I educated patient that continued use of topical steroid creams can be detrimental as well.  Patient to not use cream on face. Ambulatory referral to dermatology was placed today as patient was advised of the importance of seeing dermatology given severity of disease.  Advised patient that if they do not call her, she is to call them at provided contact information.  Advised strict follow up precautions.  Patient verbalized understanding and was agreeable with plan. Final Clinical Impressions(s) / UC Diagnoses   Final diagnoses:  Eczema, unspecified type     Discharge Instructions      I have placed an order for prednisone steroid taper as well as refilled your cream.  Referral to dermatology has been placed.  If they do not call you in the next few days, please call them yourself at provided contact.    ED Prescriptions     Medication Sig Dispense Auth. Provider   predniSONE (STERAPRED UNI-PAK 21 TAB) 10 MG (21) TBPK tablet Take by mouth daily. Take 6 tabs by mouth daily  for 2 days, then 5 tabs for 2 days, then 4 tabs for 2 days, then 3 tabs for 2 days, 2 tabs for 2 days, then 1 tab by mouth daily for 2 days 42 tablet Dieterich, Day E, [place]   triamcinolone cream (KENALOG) 0.1 % Apply 1 Application topically 2 (two) times daily. 45 g Gustavus Bryant, [place]      PDMP not reviewed this encounter.   Gustavus Bryant, [place] 03/10/23 1312

## 2023-03-10 NOTE — Discharge Instructions (Signed)
I have placed an order for prednisone steroid taper as well as refilled your cream.  Referral to dermatology has been placed.  If they do not call you in the next few days, please call them yourself at provided contact.

## 2023-03-10 NOTE — Progress Notes (Signed)
Because you have been seen for this skin issue in the past few weeks via EV, and the severity of current symptoms is above the scope of what we can properly assess and manage, I feel your condition warrants further evaluation and I recommend that you be seen in a face to face visit.  Per chart review it shows that you are at an urgent care currently. We encourage you to remain there so the provider can evaluate you.    NOTE: There will be NO CHARGE for this eVisit   If you are having a true medical emergency please call 911.      For an urgent face to face visit, [facility] has eight urgent care centers for your convenience:   NEW!! [facility] Health Urgent Care Center at [facility] Get Driving Directions [phone] [address], [address] [place], [address]    [facility] Health Urgent Care Center at [facility] Get Driving Directions [phone] [address] [place], [place] [address]   [facility] Health Urgent Care Center [facility]) Get Driving Directions [phone] [address] [place], [place] [address]  [facility] Health Urgent Care Center [facility]) Get Driving Directions [phone] [address] [place],  [place]  [address]  [facility] Health Urgent Care Center [facility] - at [employer]  [phone] [address] [place],  [place] [address]   [facility] Health Urgent Care at [facility] Get Driving Directions [phone] [address] [place], [place] [address]   [facility] Health Urgent Care at [facility] Get Driving Directions  [phone] [address] [place], [place] [address]   [facility] Health Urgent Care at [facility] Directions [phone] [address] [place], [place] [address]  Your MyChart E-visit questionnaire answers were reviewed by a board certified advanced clinical practitioner to complete your personal care  plan based on your specific symptoms.  Thank you for using e-Visits.

## 2023-03-10 NOTE — ED Triage Notes (Signed)
Patient c/o rash on chest and lips "forever".  Patient does have a PCP on 04/14/2023, so she's being seen through UC's.  The areas are itchy, painful, burning.  Patient has received prednisone and cream.  The cream lasts about 3 days.  She does apply Aquaphor w/some relief.

## 2023-03-15 ENCOUNTER — Encounter: Payer: Self-pay | Admitting: Nurse Practitioner

## 2023-03-15 ENCOUNTER — Telehealth: Payer: Medicaid Other | Admitting: Emergency Medicine

## 2023-03-15 DIAGNOSIS — L309 Dermatitis, unspecified: Secondary | ICD-10-CM

## 2023-03-15 MED ORDER — TRIAMCINOLONE ACETONIDE 0.1 % EX CREA
1.0000 | TOPICAL_CREAM | Freq: Two times a day (BID) | CUTANEOUS | 3 refills | Status: DC
Start: 2023-03-15 — End: 2023-04-05

## 2023-03-15 NOTE — Progress Notes (Signed)
Virtual Visit Consent   Amber Lyons, you are scheduled for a virtual visit with a [facility] provider today. Just as with appointments in the office, your consent must be obtained to participate. Your consent will be active for this visit and any virtual visit you may have with one of our providers in the next 365 days. If you have a MyChart account, a copy of this consent can be sent to you electronically.  As this is a virtual visit, video technology does not allow for your provider to perform a traditional examination. This may limit your provider's ability to fully assess your condition. If your provider identifies any concerns that need to be evaluated in person or the need to arrange testing (such as labs, EKG, etc.), we will make arrangements to do so. Although advances in technology are sophisticated, we cannot ensure that it will always work on either your end or our end. If the connection with a video visit is poor, the visit may have to be switched to a telephone visit. With either a video or telephone visit, we are not always able to ensure that we have a secure connection.  By engaging in this virtual visit, you consent to the provision of healthcare and authorize for your insurance to be billed (if applicable) for the services provided during this visit. Depending on your insurance coverage, you may receive a charge related to this service.  I need to obtain your verbal consent now. Are you willing to proceed with your visit today? Amber Lyons has provided verbal consent on 03/15/2023 for a virtual visit (video or telephone). Amber Horseman, PA-C  Date: 03/15/2023 12:53 PM  Virtual Visit via Video Note   I, Amber Lyons, connected with  Amber Lyons  ([id number], January 04, 1996) on 03/15/23 at 12:45 PM EDT by a video-enabled telemedicine application and verified that I am speaking with the correct person using two identifiers.  Location: Patient: Virtual Visit Location Patient:  Home Provider: Virtual Visit Location Provider: Home Office   I discussed the limitations of evaluation and management by telemedicine and the availability of in person appointments. The patient expressed understanding and agreed to proceed.    History of Present Illness: Amber Lyons is a 27 y.o. who identifies as a female who was assigned female at birth, and is being seen today for eczema.  She states that she is waiting to be seen by her PCP.  She has been using Kenalog and prednisone but has run out of the kenalog.  She is requesting a refill.  HPI: HPI  Problems: There are no problems to display for this patient.   Allergies:  Allergies  Allergen Reactions   Penicillins Hives and Swelling   Medications:  Current Outpatient Medications:    albuterol (VENTOLIN HFA) 108 (90 Base) MCG/ACT inhaler, Inhale 2 puffs into the lungs every 6 (six) hours as needed for wheezing or shortness of breath., Disp: 8 g, Rfl: 0   metroNIDAZOLE (FLAGYL) 500 MG tablet, Take 1 tablet (500 mg total) by mouth 2 (two) times daily., Disp: 14 tablet, Rfl: 0   predniSONE (STERAPRED UNI-PAK 21 TAB) 10 MG (21) TBPK tablet, Take by mouth daily. Take 6 tabs by mouth daily  for 2 days, then 5 tabs for 2 days, then 4 tabs for 2 days, then 3 tabs for 2 days, 2 tabs for 2 days, then 1 tab by mouth daily for 2 days, Disp: 42 tablet, Rfl: 0   triamcinolone cream (  KENALOG) 0.1 %, Apply 1 Application topically 2 (two) times daily., Disp: 80 g, Rfl: 3  Observations/Objective: Patient is well-developed, well-nourished in no acute distress.  Resting comfortably at home.  Head is normocephalic, atraumatic.  No labored breathing.  Speech is clear and coherent with logical content.  Patient is alert and oriented at baseline.    Assessment and Plan: 1. Eczema, unspecified type - triamcinolone cream (KENALOG) 0.1 %; Apply 1 Application topically 2 (two) times daily.  Dispense: 80 g; Refill: 3   Meds ordered this encounter   Medications   triamcinolone cream (KENALOG) 0.1 %    Sig: Apply 1 Application topically 2 (two) times daily.    Dispense:  80 g    Refill:  3    Order Specific Question:   Supervising Provider    Answer:   Merrilee Jansky [id number]     Follow Up Instructions: I discussed the assessment and treatment plan with the patient. The patient was provided an opportunity to ask questions and all were answered. The patient agreed with the plan and demonstrated an understanding of the instructions.  A copy of instructions were sent to the patient via MyChart unless otherwise noted below.     The patient was advised to call back or seek an in-person evaluation if the symptoms worsen or if the condition fails to improve as anticipated.  Time:  I spent 10 minutes with the patient via telehealth technology discussing the above problems/concerns.    Amber Horseman, PA-C

## 2023-03-15 NOTE — Patient Instructions (Signed)
  [name], thank you for joining Roxy Horseman, PA-C for today's virtual visit.  While this provider is not your primary care provider (PCP), if your PCP is located in our provider database this encounter information will be shared with them immediately following your visit.   [facility] MyChart account gives you access to today's visit and all your visits, tests, and labs performed at [facility] " click here if you don't have a [facility] MyChart account or go to [email or web address]  Consent: (Patient) [name] provided verbal consent for this virtual visit at the beginning of the encounter.  Current Medications:  Current Outpatient Medications:    albuterol (VENTOLIN HFA) 108 (90 Base) MCG/ACT inhaler, Inhale 2 puffs into the lungs every 6 (six) hours as needed for wheezing or shortness of breath., Disp: 8 g, Rfl: 0   metroNIDAZOLE (FLAGYL) 500 MG tablet, Take 1 tablet (500 mg total) by mouth 2 (two) times daily., Disp: 14 tablet, Rfl: 0   predniSONE (STERAPRED UNI-PAK 21 TAB) 10 MG (21) TBPK tablet, Take by mouth daily. Take 6 tabs by mouth daily  for 2 days, then 5 tabs for 2 days, then 4 tabs for 2 days, then 3 tabs for 2 days, 2 tabs for 2 days, then 1 tab by mouth daily for 2 days, Disp: 42 tablet, Rfl: 0   triamcinolone cream (KENALOG) 0.1 %, Apply 1 Application topically 2 (two) times daily., Disp: 45 g, Rfl: 0   Medications ordered in this encounter:  No orders of the defined types were placed in this encounter.    *If you need refills on other medications prior to your next appointment, please contact your pharmacy*  Follow-Up: Call back or seek an in-person evaluation if the symptoms worsen or if the condition fails to improve as anticipated.  [facility] [phone]  Other Instructions    If you have been instructed to have an in-person evaluation today at a local Urgent Care facility, please use the link below. It will  take you to a list of all of our available [facility], including address, phone number and hours of operation. Please do not delay care.  [facility]  If you or a family member do not have a primary care provider, use the link below to schedule a visit and establish care. When you choose a [facility] primary care physician or advanced practice provider, you gain a long-term partner in health. Find a Primary Care Provider  Learn more about [facility]'s in-office and virtual care options: [facility] - Get Care Now

## 2023-03-16 DIAGNOSIS — Z419 Encounter for procedure for purposes other than remedying health state, unspecified: Secondary | ICD-10-CM | POA: Diagnosis not present

## 2023-04-05 ENCOUNTER — Telehealth: Payer: Medicaid Other | Admitting: Family

## 2023-04-05 ENCOUNTER — Telehealth: Payer: Medicaid Other | Admitting: Physician Assistant

## 2023-04-05 DIAGNOSIS — L309 Dermatitis, unspecified: Secondary | ICD-10-CM | POA: Diagnosis not present

## 2023-04-05 MED ORDER — TRIAMCINOLONE ACETONIDE 0.1 % EX CREA
1.0000 | TOPICAL_CREAM | Freq: Two times a day (BID) | CUTANEOUS | 1 refills | Status: DC
Start: 2023-04-05 — End: 2023-04-14

## 2023-04-05 NOTE — Progress Notes (Signed)
E-Visit for Eczema  We are sorry that you are not feeling well. Here is how we plan to help! Based on what you shared with me it looks like you have eczema (atopic dermatitis).  Although the cause of eczema is not completely understood, genetics appear to play a strong role, and people with a family history of eczema are at increased risk of developing the condition. In most people with eczema, there is a genetic abnormality in the outermost layer of the skin, called the epidermis   Most people with eczema develop their first symptoms as children, before the age of 32. Intense itching of the skin, patches of redness, small bumps, and skin flaking are common. Scratching can further inflame the skin and worsen the itching. The itchiness may be more noticeable at nighttime.  Eczema commonly affects the back of the neck, the elbow creases, and the backs of the knees. Other affected areas may include the face, wrists, and forearms. The skin may become thickened and darkened, or even scarred, from repeated scratching. Eliminating factors that aggravate your eczema symptoms can help to control the symptoms. Possible triggers may include: ? Cold or dry environments ? Sweating ? Emotional stress or anxiety ? Rapid temperature changes ? Exposure to certain chemicals or cleaning solutions, including soaps and detergents, perfumes and cosmetics, wool or synthetic fibers, dust, sand, and cigarette smoke Keeping your skin hydrated Emollients -- Emollients are creams and ointments that moisturize the skin and prevent it from drying out. The best emollients for people with eczema are thick creams (such as Eucerin, Cetaphil, and Nutraderm) or ointments (such as petroleum jelly, Aquaphor, and Vaseline), which contain little to no water. Emollients are most effective when applied immediately after bathing. Emollients can be applied twice a day or more often if needed. Lotions contain more water than creams and  ointments and are less effective for moisturizing the skin. Bathing -- It is not clear if showers or baths are better for keeping the skin hydrated. Lukewarm baths or showers can hydrate and cool the skin, temporarily relieving itching from eczema. An unscented, mild soap or non-soap cleanser (such as Cetaphil) should be used sparingly. Apply an emollient immediately after bathing or showering to prevent your skin from drying out as a result of water evaporation. Emollient bath additives (products you add to the bath water) have not been found to help relieve symptoms. Hot or long baths (more than 10 to 15 minutes) and showers should be avoided since they can dry out the skin.  Based on what you shared with me you have  severe eczema.   Being that you have had multiple rounds of oral prednisone and recently prescribed Triamcinolone cream, I would recommend for you to withhold topical steroids and use a very thick moisturizer like Eucerin, Lubriderm or Aquaphor throughout the day.   I recommend dilute bleach baths for people with eczema. These baths help to decrease the number of bacteria on the skin that can cause infections or worsen symptoms. To prepare a bleach bath, one-fourth to one-half cup of bleach is placed in a full bathtub (about 40 gallons) of water. Bleach baths are usually taken for 5 to 10 minutes twice per week and should be followed by application of an emollient (listed above). I recommend you take Benadryl 25mg  - 50mg  every 4 hours to control the symptoms (including itching) but if they last over 24 hours it is best that you see an office based provider for follow up.  HOME CARE: Take lukewarm showers or baths Apply creams and ointments to prevent the skin from drying (Eucerin, Cetaphil, Nutraderm, petroleum jelly, Aquaphor or Vaseline) - these products contain less water than other lotions and are more effective for moisturizing the skin Limit exposure to cold or dry environments,  sweating, emotional stress and anxiety, rapid temperature changes and exposure to chemicals and cleaning products, soaps and detergents, perfumes, cosmetics, wool and synthetic fibers, dust, sand and cigarette- factors which can aggravate eczema symptoms.  Use a hydrocortisone cream once or twice a day Take an antihistamine like Benadryl for widespread rashes that itch.  The adult dosage of Benadryl is 25-50 mg by mouth 4 times daily. Caution: This type of medication may cause sleepiness.  Do not drink alcohol, drive, or operate dangerous machinery while taking antihistamines.  Do not take these medications if you have prostate enlargement.  Read the package instructions thoroughly on all medications that you take.  GET HELP RIGHT AWAY IF: Symptoms that don't go away after treatment. Severe itching that persists. You develop a fever. Your skin begins to drain. You have a sore throat. You become short of breath.  MAKE SURE YOU   Understand these instructions. Will watch your condition. Will get help right away if you are not doing well or get worse.    Thank you for choosing an e-visit.  Your e-visit answers were reviewed by a board certified advanced clinical practitioner to complete your personal care plan. Depending upon the condition, your plan could have included both over the counter or prescription medications.  Please review your pharmacy choice. Make sure the pharmacy is open so you can pick up prescription now. If there is a problem, you may contact your provider through [employer] and have the prescription routed to another pharmacy.  Your safety is important to [place]. If you have drug allergies check your prescription carefully.   For the next 24 hours you can use MyChart to ask questions about today's visit, request a non-urgent call back, or ask for a work or school excuse. You will get an email in the next two days asking about your experience. I hope that your e-visit  has been valuable and will speed your recovery.   I have spent 5 minutes in review of e-visit questionnaire, review and updating patient chart, medical decision making and response to patient.   Margaretann Loveless, PA-C

## 2023-04-05 NOTE — Progress Notes (Signed)
Virtual Visit Consent   Amber Lyons, you are scheduled for a virtual visit with a [facility] provider today. Just as with appointments in the office, your consent must be obtained to participate. Your consent will be active for this visit and any virtual visit you may have with one of our providers in the next 365 days. If you have a MyChart account, a copy of this consent can be sent to you electronically.  As this is a virtual visit, video technology does not allow for your provider to perform a traditional examination. This may limit your provider's ability to fully assess your condition. If your provider identifies any concerns that need to be evaluated in person or the need to arrange testing (such as labs, EKG, etc.), we will make arrangements to do so. Although advances in technology are sophisticated, we cannot ensure that it will always work on either your end or our end. If the connection with a video visit is poor, the visit may have to be switched to a telephone visit. With either a video or telephone visit, we are not always able to ensure that we have a secure connection.  By engaging in this virtual visit, you consent to the provision of healthcare and authorize for your insurance to be billed (if applicable) for the services provided during this visit. Depending on your insurance coverage, you may receive a charge related to this service.  I need to obtain your verbal consent now. Are you willing to proceed with your visit today? Amber Lyons has provided verbal consent on 04/05/2023 for a virtual visit (video or telephone). Amber Loveless, PA-C  Date: 04/05/2023 6:31 PM  Virtual Visit via Video Note   I, Amber Lyons, connected with  Amber Lyons  ([id number], Mar 23, 1996) on 04/05/23 at  5:45 PM EDT by a video-enabled telemedicine application and verified that I am speaking with the correct person using two identifiers.  Location: Patient: Virtual Visit Location  Patient: Mobile Provider: Virtual Visit Location Provider: Home Office   I discussed the limitations of evaluation and management by telemedicine and the availability of in person appointments. The patient expressed understanding and agreed to proceed.    History of Present Illness: Amber Lyons is a 27 y.o. who identifies as a female who was assigned female at birth, and is being seen today for eczema. Was seen virtually on 03/15/23 for same issue. Patient has been having significant eczema almost the whole year. She has had multiple rounds or oral prednisone and has been using Triamcinolone. Triamcinolone does offer some relief. She is scheduled to see her PCP on 04/14/23. She is trying to get a Dermatology referral for better long-term management.   On 03/15/23 she was prescribed 80g Triamcinolone with 3 refills. Per patient and pharmacy she was only give 2 tubes of 30g Triamcinolone, not 80g and no refills on file. She does show me the tube given in September, which was a 30g tube, not 80g.    Problems: There are no problems to display for this patient.   Allergies:  Allergies  Allergen Reactions   Penicillins Hives and Swelling   Medications:  Current Outpatient Medications:    albuterol (VENTOLIN HFA) 108 (90 Base) MCG/ACT inhaler, Inhale 2 puffs into the lungs every 6 (six) hours as needed for wheezing or shortness of breath., Disp: 8 g, Rfl: 0   metroNIDAZOLE (FLAGYL) 500 MG tablet, Take 1 tablet (500 mg total) by mouth 2 (two) times  daily., Disp: 14 tablet, Rfl: 0   predniSONE (STERAPRED UNI-PAK 21 TAB) 10 MG (21) TBPK tablet, Take by mouth daily. Take 6 tabs by mouth daily  for 2 days, then 5 tabs for 2 days, then 4 tabs for 2 days, then 3 tabs for 2 days, 2 tabs for 2 days, then 1 tab by mouth daily for 2 days, Disp: 42 tablet, Rfl: 0   triamcinolone cream (KENALOG) 0.1 %, Apply 1 Application topically 2 (two) times daily., Disp: 80 g, Rfl: 1  Observations/Objective: Patient is  well-developed, well-nourished in no acute distress.  Resting comfortably   Head is normocephalic, atraumatic.  No labored breathing.  Speech is clear and coherent with logical content.  Patient is alert and oriented at baseline.    Assessment and Plan: 1. Eczema, unspecified type - triamcinolone cream (KENALOG) 0.1 %; Apply 1 Application topically 2 (two) times daily.  Dispense: 80 g; Refill: 1  - Refilled Triamcinolone since at last visit was not given the dose prescribed - I do think she is going to need better long-term intervention as her eczema is severe and diffusely spread, also some symptoms may be secondary to long term topical steroid use - Keep scheduled appt with PCP on 04/14/23  Follow Up Instructions: I discussed the assessment and treatment plan with the patient. The patient was provided an opportunity to ask questions and all were answered. The patient agreed with the plan and demonstrated an understanding of the instructions.  A copy of instructions were sent to the patient via MyChart unless otherwise noted below.    The patient was advised to call back or seek an in-person evaluation if the symptoms worsen or if the condition fails to improve as anticipated.    Amber Loveless, PA-C

## 2023-04-05 NOTE — Patient Instructions (Signed)
[name], thank you for joining Margaretann Loveless, PA-C for today's virtual visit.  While this provider is not your primary care provider (PCP), if your PCP is located in our provider database this encounter information will be shared with them immediately following your visit.   A [facility] MyChart account gives you access to today's visit and all your visits, tests, and labs performed at [facility] " click here if you don't have a [facility] MyChart account or go to [email or web address]  Consent: (Patient) [name] provided verbal consent for this virtual visit at the beginning of the encounter.  Current Medications:  Current Outpatient Medications:    albuterol (VENTOLIN HFA) 108 (90 Base) MCG/ACT inhaler, Inhale 2 puffs into the lungs every 6 (six) hours as needed for wheezing or shortness of breath., Disp: 8 g, Rfl: 0   metroNIDAZOLE (FLAGYL) 500 MG tablet, Take 1 tablet (500 mg total) by mouth 2 (two) times daily., Disp: 14 tablet, Rfl: 0   predniSONE (STERAPRED UNI-PAK 21 TAB) 10 MG (21) TBPK tablet, Take by mouth daily. Take 6 tabs by mouth daily  for 2 days, then 5 tabs for 2 days, then 4 tabs for 2 days, then 3 tabs for 2 days, 2 tabs for 2 days, then 1 tab by mouth daily for 2 days, Disp: 42 tablet, Rfl: 0   triamcinolone cream (KENALOG) 0.1 %, Apply 1 Application topically 2 (two) times daily., Disp: 80 g, Rfl: 1   Medications ordered in this encounter:  Meds ordered this encounter  Medications   triamcinolone cream (KENALOG) 0.1 %    Sig: Apply 1 Application topically 2 (two) times daily.    Dispense:  80 g    Refill:  1    Order Specific Question:   Supervising Provider    Answer:   Merrilee Jansky [id number]     *If you need refills on other medications prior to your next appointment, please contact your pharmacy*  Follow-Up: Call back or seek an in-person evaluation if the symptoms worsen or if the condition fails to improve as  anticipated.   Virtual Care [phone]  Other Instructions Eczema Eczema refers to a group of skin conditions that cause skin to become rough and inflamed. Each type of eczema has different triggers, symptoms, and treatments. Eczema of any type is usually itchy. Symptoms range from mild to severe. Eczema is not spread from person to person (is not contagious). It can appear on different parts of the body at different times. One person's eczema may look different from another person's eczema. What are the causes? The exact cause of this condition is not known. However, exposure to certain environmental factors, irritants, and allergens can make the condition worse. What are the signs or symptoms? Symptoms of this condition depend on the type of eczema you have. The types include: Contact dermatitis. There are two kinds: Irritant contact dermatitis. This happens when something irritates the skin and causes a rash. Allergic contact dermatitis. This happens when your skin comes in contact with something you are allergic to (allergens). This can include poison ivy, chemicals, or medicines that were applied to your skin. Atopic dermatitis. This is a long-term (chronic) skin disease that keeps coming back (recurring). It is the most common type of eczema. Usual symptoms are a red rash and itchy, dry, scaly skin. It usually starts showing signs in infancy and can last through adulthood. Dyshidrotic eczema. This is a form of eczema on  the hands and feet. It shows up as very itchy, fluid-filled blisters. It can affect people of any age but is more common before age 76. Hand eczema. This causes very itchy areas of skin on the palms and sides of the hands and fingers. This type of eczema is common in industrial jobs where you may be exposed to different types of irritants. Lichen simplex chronicus. This type of eczema occurs when a person constantly scratches one area of the body. Repeated  scratching of the area leads to thickened skin (lichenification). This condition can accompany other types of eczema. It is more common in adults but may also be seen in children. Nummular eczema. This is a common type of eczema that most often affects the lower legs and the backs of the hands. It typically causes an itchy, red, circular, crusty lesion (plaque). Scratching may become a habit and can cause bleeding. Nummular eczema occurs most often in middle-aged or older people. Seborrheic dermatitis. This is a common skin disease that mainly affects the scalp. It may also affect other oily areas of the body, such as the face, sides of the nose, eyebrows, ears, eyelids, and chest. It is marked by small scaling and redness of the skin (erythema). This can affect people of all ages. In infants, this condition is called cradle cap. Stasis dermatitis. This is a common skin disease that can cause itching, scaling, and hyperpigmentation, usually on the legs and feet. It occurs most often in people who have a condition that prevents blood from being pumped through the veins in the legs (chronic venous insufficiency). Stasis dermatitis is a chronic condition that needs long-term management. How is this diagnosed? This condition may be diagnosed based on: A physical exam of your skin. Your medical history. Skin patch tests. These tests involve using patches that contain possible allergens and placing them on your back. Your health care provider will check in a few days to see if an allergic reaction occurred. How is this treated? Treatment for eczema is based on the type of eczema you have. You may be given hydrocortisone steroid medicine or antihistamines. These can relieve itching quickly and help reduce inflammation. These may be prescribed or purchased over the counter, depending on the strength that is needed. Follow these instructions at home: Take or apply over-the-counter and prescription medicines only  as told by your health care provider. Use creams or ointments to moisturize your skin. Do not use lotions. Learn what triggers or irritates your symptoms so you can avoid these things. Treat symptom flare-ups quickly. Do not scratch your skin. This can make your rash worse. Keep all follow-up visits. This is important. Where to find more information American Academy of Dermatology: MarketingSheets.si National Eczema Association: nationaleczema.org The Society for Pediatric Dermatology: pedsderm.net Contact a health care provider if: You have severe itching, even with treatment. You scratch your skin regularly until it bleeds. Your rash looks different than usual. Your skin is painful, swollen, or more red than usual. You have a fever. Summary Eczema refers to a group of skin conditions that cause skin to become rough and inflamed. Each type has different triggers. Eczema of any type causes itching that may range from mild to severe. Treatment varies based on the type of eczema you have. Hydrocortisone steroid medicine or antihistamines can help with itching and inflammation. Protecting your skin is the best way to prevent eczema. Use creams or ointments to moisturize your skin. Avoid triggers and irritants. Treat flare-ups quickly. This  information is not intended to replace advice given to you by your health care provider. Make sure you discuss any questions you have with your health care provider. Document Revised: 03/08/2020 Document Reviewed: 03/11/2020 Elsevier Patient Education  2024 Elsevier Inc.    If you have been instructed to have an in-person evaluation today at a local Urgent Care facility, please use the link below. It will take you to a list of all of our available [facility], including address, phone number and hours of operation. Please do not delay care.  [facility]  If you or a family member do not have a primary care provider, use the link below to  schedule a visit and establish care. When you choose a [facility] primary care physician or advanced practice provider, you gain a long-term partner in health. Find a Primary Care Provider  Learn more about [facility]'s in-office and virtual care options: [facility] - Get Care Now

## 2023-04-07 ENCOUNTER — Telehealth: Payer: Medicaid Other | Admitting: Physician Assistant

## 2023-04-07 DIAGNOSIS — L2084 Intrinsic (allergic) eczema: Secondary | ICD-10-CM

## 2023-04-07 NOTE — Progress Notes (Signed)
Answered all questions for patient. Had prescribed Triamcinolone 1% 80g. Only given Triamcinolone 1% 60g (2x 30g). Called pharmacy, did not have 80g in stock. Supplied with what was available.  Keep scheduled appt with PCP on 04/14/23.  No charge visit

## 2023-04-07 NOTE — Progress Notes (Signed)
Patient was seen via video by PA Burnette. EV closed and marked as no charge.

## 2023-04-14 ENCOUNTER — Ambulatory Visit (INDEPENDENT_AMBULATORY_CARE_PROVIDER_SITE_OTHER): Payer: Medicaid Other | Admitting: Nurse Practitioner

## 2023-04-14 ENCOUNTER — Encounter: Payer: Self-pay | Admitting: Nurse Practitioner

## 2023-04-14 VITALS — BP 108/64 | HR 83 | Temp 98.4°F | Ht 66.0 in | Wt 229.0 lb

## 2023-04-14 DIAGNOSIS — J4520 Mild intermittent asthma, uncomplicated: Secondary | ICD-10-CM

## 2023-04-14 DIAGNOSIS — L309 Dermatitis, unspecified: Secondary | ICD-10-CM

## 2023-04-14 DIAGNOSIS — E66812 Obesity, class 2: Secondary | ICD-10-CM | POA: Diagnosis not present

## 2023-04-14 DIAGNOSIS — Z23 Encounter for immunization: Secondary | ICD-10-CM | POA: Diagnosis not present

## 2023-04-14 DIAGNOSIS — J4521 Mild intermittent asthma with (acute) exacerbation: Secondary | ICD-10-CM

## 2023-04-14 DIAGNOSIS — Z113 Encounter for screening for infections with a predominantly sexual mode of transmission: Secondary | ICD-10-CM

## 2023-04-14 DIAGNOSIS — Z6836 Body mass index (BMI) 36.0-36.9, adult: Secondary | ICD-10-CM

## 2023-04-14 LAB — COMPREHENSIVE METABOLIC PANEL
ALT: 16 U/L (ref 0–35)
AST: 15 U/L (ref 0–37)
Albumin: 4.3 g/dL (ref 3.5–5.2)
Alkaline Phosphatase: 87 U/L (ref 39–117)
BUN: 5 mg/dL — ABNORMAL LOW (ref 6–23)
CO2: 29 meq/L (ref 19–32)
Calcium: 9.1 mg/dL (ref 8.4–10.5)
Chloride: 102 meq/L (ref 96–112)
Creatinine, Ser: 0.64 mg/dL (ref 0.40–1.20)
GFR: 122.19 mL/min (ref >60.00)
Glucose, Bld: 94 mg/dL (ref 70–99)
Potassium: 3.5 meq/L (ref 3.5–5.1)
Sodium: 137 meq/L (ref 135–145)
Total Bilirubin: 0.3 mg/dL (ref 0.2–1.2)
Total Protein: 7.8 g/dL (ref 6.0–8.3)

## 2023-04-14 LAB — CBC
HCT: 37 % (ref 36.0–46.0)
Hemoglobin: 11.9 g/dL — ABNORMAL LOW (ref 12.0–15.0)
MCHC: 32.3 g/dL (ref 30.0–36.0)
MCV: 91.1 fL (ref 78.0–100.0)
Platelets: 325 10*3/uL (ref 150.0–400.0)
RBC: 4.06 Mil/uL (ref 3.87–5.11)
RDW: 14.2 % (ref 11.5–15.5)
WBC: 9.9 10*3/uL (ref 4.0–10.5)

## 2023-04-14 LAB — LIPID PANEL
Cholesterol: 156 mg/dL (ref 0–200)
HDL: 51.9 mg/dL (ref >39.00)
LDL Cholesterol: 95 mg/dL (ref 0–99)
NonHDL: 104.02
Total CHOL/HDL Ratio: 3
Triglycerides: 44 mg/dL (ref 0.0–149.0)
VLDL: 8.8 mg/dL (ref 0.0–40.0)

## 2023-04-14 LAB — TSH: TSH: 1.31 u[IU]/mL (ref 0.35–5.50)

## 2023-04-14 LAB — HEMOGLOBIN A1C: Hgb A1c MFr Bld: 5.8 % (ref 4.6–6.5)

## 2023-04-14 MED ORDER — TRIAMCINOLONE ACETONIDE 0.1 % EX CREA: 1.0000 | TOPICAL_CREAM | Freq: Two times a day (BID) | CUTANEOUS | 1 refills | Status: DC

## 2023-04-14 MED ORDER — TRIAMCINOLONE ACETONIDE 0.1 % EX CREA: 1.0000 | TOPICAL_CREAM | Freq: Two times a day (BID) | CUTANEOUS | 0 refills | Status: DC

## 2023-04-14 MED ORDER — ALBUTEROL SULFATE HFA 108 (90 BASE) MCG/ACT IN AERS: 2.0000 | INHALATION_SPRAY | Freq: Four times a day (QID) | RESPIRATORY_TRACT | 1 refills | Status: DC | PRN

## 2023-04-14 NOTE — Assessment & Plan Note (Signed)
Flu shot ministered, VIS provided.

## 2023-04-14 NOTE — Assessment & Plan Note (Signed)
Chronic From her report seems asthma is mild and currently well-controlled.  Will prescribe albuterol inhaler that patient can take as needed for bronchospasm.

## 2023-04-14 NOTE — Addendum Note (Signed)
Addended by: Cathleen Fears, Denys Labree P on: 04/14/2023 04:53 PM   Modules accepted: Orders

## 2023-04-14 NOTE — Progress Notes (Signed)
New Patient Office Visit  Subjective    Patient ID: Amber Lyons, female    DOB: 12/17/1995  Age: 27 y.o. MRN: [id number]  CC:  Chief Complaint  Patient presents with   New Patient (Initial Visit)    Talk about her asthma,eczema.     HPI Amber Lyons presents to establish care Has not routinely seen a PCP, but wants to establish care to help get better control of her chronic eczema. Would also like to discuss her asthma as well.   Eczema: Diagnosed as a baby.  Uses triamcinolone as needed.  Reports that she is having almost constant flares.  Will also use Vaseline or Eucerin cream for moisturizer.  Asthma: Diagnosed in childhood.  Is requesting refill on albuterol inhaler as she is out of this.  Reports needing it less than once a week.  Triggers include physical activity, cold air.  Patient is also requesting STD screening and accepts offered for flu shot to be administered.  Outpatient Encounter Medications as of 04/14/2023  Medication Sig   triamcinolone cream (KENALOG) 0.1 % Apply 1 Application topically 2 (two) times daily.   [DISCONTINUED] albuterol (VENTOLIN HFA) 108 (90 Base) MCG/ACT inhaler Inhale 2 puffs into the lungs every 6 (six) hours as needed for wheezing or shortness of breath.   albuterol (VENTOLIN HFA) 108 (90 Base) MCG/ACT inhaler Inhale 2 puffs into the lungs every 6 (six) hours as needed for wheezing or shortness of breath.   [DISCONTINUED] cetirizine (ZYRTEC) 10 MG tablet Take 1 tablet (10 mg total) by mouth daily.   [DISCONTINUED] fluticasone (FLONASE) 50 MCG/ACT nasal spray Place 2 sprays into both nostrils daily.   [DISCONTINUED] metroNIDAZOLE (FLAGYL) 500 MG tablet Take 1 tablet (500 mg total) by mouth 2 (two) times daily.   [DISCONTINUED] predniSONE (STERAPRED UNI-PAK 21 TAB) 10 MG (21) TBPK tablet Take by mouth daily. Take 6 tabs by mouth daily  for 2 days, then 5 tabs for 2 days, then 4 tabs for 2 days, then 3 tabs for 2 days, 2 tabs for 2 days, then 1  tab by mouth daily for 2 days   [DISCONTINUED] ranitidine (ZANTAC) 150 MG tablet Take 1 tablet (150 mg total) by mouth 2 (two) times daily for 5 days. (Patient not taking: Reported on 08/04/2017)   [DISCONTINUED] triamcinolone cream (KENALOG) 0.1 % Apply 1 Application topically 2 (two) times daily.   No facility-administered encounter medications on file as of 04/14/2023.    Past Medical History:  Diagnosis Date   Asthma    Eczema     Past Surgical History:  Procedure Laterality Date   NO PAST SURGERIES      Family History  Problem Relation Age of Onset   Asthma Mother    Asthma Father     Social History   Socioeconomic History   Marital status: Single    Spouse name: Not on file   Number of children: Not on file   Years of education: Not on file   Highest education level: Not on file  Occupational History   Not on file  Tobacco Use   Smoking status: Former    Current packs/day: 2.00    Types: Cigars, Cigarettes   Smokeless tobacco: Never  Vaping Use   Vaping status: Never Used  Substance and Sexual Activity   Alcohol use: Yes    Comment: social   Drug use: Yes    Types: Marijuana    Comment: last use marijuana was 05-15-2017 at  7 pm.   Sexual activity: Not on file  Other Topics Concern   Not on file  Social History Narrative   Not on file   Social Determinants of Health   Financial Resource Strain: Not on file  Food Insecurity: Not on file  Transportation Needs: Not on file  Physical Activity: Not on file  Stress: Not on file  Social Connections: Not on file  Intimate Partner Violence: Not on file    Review of Systems  Constitutional:  Negative for chills and fever.  Respiratory:  Positive for cough (yesterday) and wheezing (yesterday).   Cardiovascular:  Positive for palpitations (yesterday). Negative for chest pain.  Gastrointestinal:  Negative for blood in stool.  Genitourinary:  Negative for hematuria.  Psychiatric/Behavioral:  Negative for  suicidal ideas.         Objective    BP 108/64   Pulse 83   Temp 98.4 F (36.9 C) (Temporal)   Ht 5\' 6"  (1.676 m)   Wt 229 lb (103.9 kg)   LMP 04/02/2023   SpO2 99%   BMI 36.96 kg/m   Physical Exam Vitals reviewed.  Constitutional:      General: She is not in acute distress.    Appearance: Normal appearance.  HENT:     Head: Normocephalic and atraumatic.  Neck:     Vascular: No carotid bruit.  Cardiovascular:     Rate and Rhythm: Normal rate and regular rhythm.     Pulses: Normal pulses.     Heart sounds: Normal heart sounds.  Pulmonary:     Effort: Pulmonary effort is normal.     Breath sounds: Normal breath sounds.  Skin:    General: Skin is warm and dry.          Comments: Red lines indicate areas of visible eczema flares  Neurological:     General: No focal deficit present.     Mental Status: She is alert and oriented to person, place, and time.  Psychiatric:        Mood and Affect: Mood normal.        Behavior: Behavior normal.        Judgment: Judgment normal.         Assessment & Plan:   Problem List Items Addressed This Visit       Respiratory   Mild intermittent asthma    Chronic From her report seems asthma is mild and currently well-controlled.  Will prescribe albuterol inhaler that patient can take as needed for bronchospasm.      Relevant Medications   albuterol (VENTOLIN HFA) 108 (90 Base) MCG/ACT inhaler     Musculoskeletal and Integument   Eczema - Primary    Chronic, not controlled Treat with triamcinolone cream that she can apply twice a day for up to 14 days.  Also recommend twice a day use of moisturizer that is either ointment or cream based.  Avoidance of hot showers/baths, use of cold compress, and considering trying a daily antihistamine.  Referral to dermatology made as well to assist with managing her chronic eczema.      Relevant Medications   triamcinolone cream (KENALOG) 0.1 %   Other Relevant Orders   Ambulatory  referral to Dermatology   Comprehensive metabolic panel   CBC   Hemoglobin A1c   Lipid panel   TSH     Other   Class 2 obesity with body mass index (BMI) of 36.0 to 36.9 in adult    Chronic Labs ordered,  further recommendations may be made based upon these results.      Relevant Orders   Comprehensive metabolic panel   CBC   Hemoglobin A1c   Lipid panel   TSH   Screening examination for STD (sexually transmitted disease)    Labs ordered, further recommendations may be made based upon his results       Relevant Orders   HIV Antibody (routine testing w rflx)   RPR   NuSwab Vaginitis Plus (VG+)   Needs flu shot    Flu shot ministered, VIS provided       Return in about 1 month (around 05/15/2023) for CPE with Maralyn Sago.   Elenore Paddy, NP

## 2023-04-14 NOTE — Patient Instructions (Signed)
Can try allegra or zyrtec during the day to help reduce itching

## 2023-04-14 NOTE — Addendum Note (Signed)
Addended by: Elenore Paddy on: 04/14/2023 03:36 PM   Modules accepted: Orders

## 2023-04-14 NOTE — Assessment & Plan Note (Signed)
Labs ordered, further recommendations may be made based upon his results. 

## 2023-04-14 NOTE — Assessment & Plan Note (Signed)
Chronic, not controlled Treat with triamcinolone cream that she can apply twice a day for up to 14 days.  Also recommend twice a day use of moisturizer that is either ointment or cream based.  Avoidance of hot showers/baths, use of cold compress, and considering trying a daily antihistamine.  Referral to dermatology made as well to assist with managing her chronic eczema.

## 2023-04-14 NOTE — Assessment & Plan Note (Signed)
Chronic Labs ordered, further recommendations may be made based upon these results.

## 2023-04-15 LAB — RPR: RPR Ser Ql: NONREACTIVE

## 2023-04-15 LAB — HIV ANTIBODY (ROUTINE TESTING W REFLEX): HIV 1&2 Ab, 4th Generation: NONREACTIVE

## 2023-04-16 DIAGNOSIS — Z419 Encounter for procedure for purposes other than remedying health state, unspecified: Secondary | ICD-10-CM | POA: Diagnosis not present

## 2023-04-16 LAB — NUSWAB VAGINITIS PLUS (VG+)
Atopobium vaginae: HIGH {score} — AB
BVAB 2: HIGH {score} — AB
Candida albicans, NAA: POSITIVE — AB
Candida glabrata, NAA: NEGATIVE
Chlamydia trachomatis, NAA: NEGATIVE
Megasphaera 1: HIGH {score} — AB
Neisseria gonorrhoeae, NAA: NEGATIVE
Trich vag by NAA: NEGATIVE

## 2023-04-17 ENCOUNTER — Other Ambulatory Visit: Payer: Self-pay | Admitting: Nurse Practitioner

## 2023-04-17 DIAGNOSIS — N760 Acute vaginitis: Secondary | ICD-10-CM

## 2023-04-17 DIAGNOSIS — B3731 Acute candidiasis of vulva and vagina: Secondary | ICD-10-CM

## 2023-04-17 MED ORDER — METRONIDAZOLE 0.75 % EX GEL: CUTANEOUS | 0 refills | Status: DC

## 2023-04-17 MED ORDER — FLUCONAZOLE 150 MG PO TABS
150.0000 mg | ORAL_TABLET | Freq: Every day | ORAL | 0 refills | Status: DC
Start: 2023-04-17 — End: 2023-06-11

## 2023-05-16 DIAGNOSIS — Z419 Encounter for procedure for purposes other than remedying health state, unspecified: Secondary | ICD-10-CM | POA: Diagnosis not present

## 2023-06-02 ENCOUNTER — Other Ambulatory Visit: Payer: Self-pay | Admitting: Nurse Practitioner

## 2023-06-02 DIAGNOSIS — L309 Dermatitis, unspecified: Secondary | ICD-10-CM

## 2023-06-04 ENCOUNTER — Telehealth: Payer: Medicaid Other | Admitting: Physician Assistant

## 2023-06-04 ENCOUNTER — Other Ambulatory Visit: Payer: Self-pay | Admitting: Nurse Practitioner

## 2023-06-04 DIAGNOSIS — L2084 Intrinsic (allergic) eczema: Secondary | ICD-10-CM

## 2023-06-04 DIAGNOSIS — L309 Dermatitis, unspecified: Secondary | ICD-10-CM

## 2023-06-04 MED ORDER — TRIAMCINOLONE ACETONIDE 0.1 % EX CREA: 1.0000 | TOPICAL_CREAM | Freq: Two times a day (BID) | CUTANEOUS | 0 refills | Status: DC

## 2023-06-04 NOTE — Progress Notes (Signed)

## 2023-06-05 ENCOUNTER — Encounter: Payer: Self-pay | Admitting: Nurse Practitioner

## 2023-06-07 ENCOUNTER — Other Ambulatory Visit: Payer: Self-pay

## 2023-06-07 DIAGNOSIS — L2084 Intrinsic (allergic) eczema: Secondary | ICD-10-CM

## 2023-06-07 MED ORDER — TRIAMCINOLONE ACETONIDE 0.1 % EX CREA: 1.0000 | TOPICAL_CREAM | Freq: Two times a day (BID) | CUTANEOUS | 0 refills | Status: DC

## 2023-06-11 ENCOUNTER — Telehealth: Payer: Self-pay | Admitting: Nurse Practitioner

## 2023-06-11 ENCOUNTER — Other Ambulatory Visit: Payer: Self-pay | Admitting: Nurse Practitioner

## 2023-06-11 ENCOUNTER — Other Ambulatory Visit (HOSPITAL_COMMUNITY): Payer: Self-pay

## 2023-06-11 ENCOUNTER — Ambulatory Visit (INDEPENDENT_AMBULATORY_CARE_PROVIDER_SITE_OTHER): Payer: Medicaid Other | Admitting: Nurse Practitioner

## 2023-06-11 ENCOUNTER — Telehealth: Payer: Self-pay

## 2023-06-11 VITALS — BP 126/74 | HR 110 | Temp 97.7°F | Ht 66.0 in | Wt 229.0 lb

## 2023-06-11 DIAGNOSIS — J4521 Mild intermittent asthma with (acute) exacerbation: Secondary | ICD-10-CM | POA: Diagnosis not present

## 2023-06-11 DIAGNOSIS — L309 Dermatitis, unspecified: Secondary | ICD-10-CM

## 2023-06-11 DIAGNOSIS — Z0000 Encounter for general adult medical examination without abnormal findings: Secondary | ICD-10-CM

## 2023-06-11 DIAGNOSIS — Z124 Encounter for screening for malignant neoplasm of cervix: Secondary | ICD-10-CM | POA: Insufficient documentation

## 2023-06-11 DIAGNOSIS — Z0001 Encounter for general adult medical examination with abnormal findings: Secondary | ICD-10-CM | POA: Insufficient documentation

## 2023-06-11 DIAGNOSIS — Z113 Encounter for screening for infections with a predominantly sexual mode of transmission: Secondary | ICD-10-CM | POA: Insufficient documentation

## 2023-06-11 LAB — POC COVID19 BINAXNOW: SARS Coronavirus 2 Ag: NEGATIVE

## 2023-06-11 LAB — POCT INFLUENZA A/B
Influenza A, POC: NEGATIVE
Influenza B, POC: NEGATIVE

## 2023-06-11 LAB — POCT RESPIRATORY SYNCYTIAL VIRUS: RSV Rapid Ag: NEGATIVE

## 2023-06-11 MED ORDER — ALBUTEROL SULFATE (2.5 MG/3ML) 0.083% IN NEBU: 2.5000 mg | INHALATION_SOLUTION | Freq: Four times a day (QID) | RESPIRATORY_TRACT | 1 refills | Status: AC | PRN

## 2023-06-11 MED ORDER — PREDNISONE 20 MG PO TABS: 40.0000 mg | ORAL_TABLET | Freq: Every day | ORAL | 0 refills | Status: DC

## 2023-06-11 MED ORDER — ZORYVE 0.15 % EX CREA: 1.0000 | TOPICAL_CREAM | Freq: Every day | CUTANEOUS | 1 refills | Status: DC

## 2023-06-11 NOTE — Assessment & Plan Note (Signed)
Chronic, currently flared Uses triamcinolone cream, however this is failed to control symptoms long-term.  Will try to get prior authorization for Roflumilast cream as well as referred to dermatology for assistance with evaluation and management.  Proper use of roflumilast cream discussed today in office. Patient also being treated today with oral prednisone for asthma exacerbation, this may help with current rashes.

## 2023-06-11 NOTE — Assessment & Plan Note (Signed)
Per patient request will check for HSV antibodies 1 and 2.

## 2023-06-11 NOTE — Assessment & Plan Note (Signed)
Acute, likely viral URI underlying exacerbation.  SpO2 95% room air today.  Slightly tachycardic but blood pressure normal and at goal. Treat with albuterol nebulizer treatment in office today. Prescribed prednisone 40 mg every morning x 5 days. Patient was encouraged to pick up albuterol inhaler at home, will also prescribe nebulizer and nebulized albuterol solution that patient can use as needed. Patient told to symptoms persist an additional week to call office at which point may consider treatment with antibiotic therapy.  Also told that if symptoms worsen especially if she has more shortness of breath or wheezing over the next week she should proceed to the emergency department.

## 2023-06-11 NOTE — Telephone Encounter (Signed)
Pharmacy Patient Advocate Encounter   Received notification from Pt Calls Messages that prior authorization for Zoryve 0.15% cream is required/requested.   Insurance verification completed.   The patient is insured through [facility] [place] .   Per test claim: PA required; PA submitted to above mentioned insurance via CoverMyMeds Key/confirmation #/[id number] Status is pending

## 2023-06-11 NOTE — Telephone Encounter (Signed)
Please start prior authorization for Zoryve 0.15% topical cream for eczema. Has tried and failed triamcinolone. Please let me know if insurance approves or denies.

## 2023-06-11 NOTE — Progress Notes (Signed)
Complete physical exam  Patient: Amber Lyons   DOB: February 06, 1996   27 y.o. Female  MRN: [id number]  Subjective:    Chief Complaint  Patient presents with   Annual Exam    Amber Lyons is a 27 y.o. female who presents today for a complete physical exam. She reports consuming a general diet.  Exercise: none  She generally feels fairly well. She reports sleeping poorly. She does have additional problems to discuss today.   Cough/wheezing: Symptom onset 3 to 4 days ago.  Patient has underlying asthma.  Cough is productive with phlegm and mucus.  She does have wheezing and shortness of breath especially with exertion.  Some chest tightness with exertion.  Has been using albuterol inhaler at home, reports never feeling inhaler that I prescribed at last visit but she is using her mom's inhaler.  She does have temporary relief with use of this.  Reports it has been at least 4 hours since last administration of albuterol inhaler.  Eczema: Ran out of triamcinolone cream, currently eczema is flared and causing itching.  She was unable to schedule appointment with dermatology, but would still like to pursue this.  She also researched Dupixent and would like to discuss this with dermatology.  Upon researching she read risk of herpes outbreak, she would like to be tested for herpes antibodies today.  Denies any history of oral or genital outbreak.   Most recent fall risk assessment:     No data to display           Most recent depression screenings:     No data to display          Vision:Not within last year  and Dental: No current dental problems and No regular dental care   Patient Active Problem List   Diagnosis Date Noted   Screening examination for STI 06/11/2023   Cervical cancer screening 06/11/2023   Mild intermittent asthma with exacerbation 06/11/2023   Encounter for general adult medical examination with abnormal findings 06/11/2023   Eczema 04/14/2023   Mild intermittent  asthma 04/14/2023   Class 2 obesity with body mass index (BMI) of 36.0 to 36.9 in adult 04/14/2023   Screening examination for STD (sexually transmitted disease) 04/14/2023   Needs flu shot 04/14/2023   Past Medical History:  Diagnosis Date   Asthma    Eczema    Past Surgical History:  Procedure Laterality Date   NO PAST SURGERIES     Social History   Socioeconomic History   Marital status: Single    Spouse name: Not on file   Number of children: Not on file   Years of education: Not on file   Highest education level: Not on file  Occupational History   Not on file  Tobacco Use   Smoking status: Former    Current packs/day: 2.00    Types: Cigars, Cigarettes   Smokeless tobacco: Never  Vaping Use   Vaping status: Never Used  Substance and Sexual Activity   Alcohol use: Yes    Comment: social   Drug use: Yes    Types: Marijuana    Comment: last use marijuana was 05-15-2017 at 7 pm.   Sexual activity: Not on file  Other Topics Concern   Not on file  Social History Narrative   Not on file   Social Drivers of Health   Financial Resource Strain: Not on file  Food Insecurity: Not on file  Transportation Needs: Not on  file  Physical Activity: Not on file  Stress: Not on file  Social Connections: Not on file  Intimate Partner Violence: Not on file   Family History  Problem Relation Age of Onset   Asthma Mother    Asthma Father    Allergies  Allergen Reactions   Penicillins Hives and Swelling      Patient Care Team: Elenore Paddy, NP as PCP - General (Nurse Practitioner)   Outpatient Medications Prior to Visit  Medication Sig   albuterol (VENTOLIN HFA) 108 (90 Base) MCG/ACT inhaler Inhale 2 puffs into the lungs every 6 (six) hours as needed for wheezing or shortness of breath.   triamcinolone cream (KENALOG) 0.1 % Apply 1 Application topically 2 (two) times daily.   [DISCONTINUED] fluconazole (DIFLUCAN) 150 MG tablet Take 1 tablet (150 mg total) by mouth  daily.   [DISCONTINUED] metroNIDAZOLE (METROGEL) 0.75 % gel Insert 1 application vaginally daily at bedtime x 5 days   No facility-administered medications prior to visit.    Review of Systems  Constitutional:  Negative for chills and fever.  HENT:  Positive for congestion.   Eyes:  Negative for blurred vision and double vision.  Respiratory:  Positive for cough, sputum production (phlegm and mucus), shortness of breath and wheezing.   Cardiovascular:  Positive for chest pain (with shortness of breath/wheezing otherwise none) and palpitations (with shortness of breath/wheezing otherwise none).  Gastrointestinal:  Negative for abdominal pain and blood in stool.  Genitourinary:  Negative for dysuria and hematuria.  Musculoskeletal:  Positive for myalgias.  Skin:  Positive for itching and rash.  Neurological:  Negative for seizures, loss of consciousness and headaches.  Psychiatric/Behavioral:  Positive for depression. Negative for suicidal ideas. The patient is nervous/anxious and has insomnia.           Objective:     BP 126/74   Pulse (!) 110   Temp 97.7 F (36.5 C) (Temporal)   Ht 5\' 6"  (1.676 m)   Wt 229 lb (103.9 kg)   LMP 06/02/2023   SpO2 95%   BMI 36.96 kg/m  BP Readings from Last 3 Encounters:  06/11/23 126/74  04/14/23 108/64  03/10/23 (!) 151/86   Wt Readings from Last 3 Encounters:  06/11/23 229 lb (103.9 kg)  04/14/23 229 lb (103.9 kg)  03/10/23 226 lb (102.5 kg)         No data to display           Physical Exam Vitals reviewed. Exam conducted with a chaperone present.  Constitutional:      Appearance: Normal appearance.  HENT:     Head: Normocephalic and atraumatic.     Right Ear: Tympanic membrane, ear canal and external ear normal.     Left Ear: Tympanic membrane, ear canal and external ear normal.  Eyes:     General:        Right eye: No discharge.        Left eye: No discharge.     Extraocular Movements: Extraocular movements  intact.     Conjunctiva/sclera: Conjunctivae normal.     Pupils: Pupils are equal, round, and reactive to light.  Neck:     Vascular: No carotid bruit.  Cardiovascular:     Rate and Rhythm: Regular rhythm. Tachycardia present.     Pulses: Normal pulses.     Heart sounds: Normal heart sounds. No murmur heard. Pulmonary:     Effort: Pulmonary effort is normal.     Breath sounds: Wheezing  present.  Chest:  Breasts:    Breasts are symmetrical.     Right: Normal.     Left: Normal.  Abdominal:     General: Abdomen is flat. Bowel sounds are normal. There is no distension.     Palpations: Abdomen is soft. There is no mass.     Tenderness: There is no abdominal tenderness.  Musculoskeletal:        General: No tenderness.     Cervical back: Neck supple. No muscular tenderness.     Right lower leg: No edema.     Left lower leg: No edema.  Lymphadenopathy:     Cervical: No cervical adenopathy.     Upper Body:     Right upper body: No supraclavicular adenopathy.     Left upper body: No supraclavicular adenopathy.  Skin:    General: Skin is warm and dry.     Comments: Plaques on extensor surfaces (wrists) also on back and on face.  Neurological:     General: No focal deficit present.     Mental Status: She is alert and oriented to person, place, and time.     Motor: No weakness.     Gait: Gait normal.  Psychiatric:        Mood and Affect: Mood normal.        Behavior: Behavior normal.        Judgment: Judgment normal.      Results for orders placed or performed in visit on 06/11/23  POC COVID-19 BinaxNow  Result Value Ref Range   SARS Coronavirus 2 Ag Negative Negative  POCT Influenza A/B  Result Value Ref Range   Influenza A, POC Negative Negative   Influenza B, POC Negative Negative  POCT respiratory syncytial virus  Result Value Ref Range   RSV Rapid Ag negative        Assessment & Plan:    Routine Health Maintenance and Physical Exam  Immunization History   Administered Date(s) Administered   Influenza, Seasonal, Injecte, Preservative Fre 04/14/2023   PPD Test 07/07/2021    Health Maintenance  Topic Date Due   HPV VACCINES (1 - 3-dose series) Never done   DTaP/Tdap/Td (1 - Tdap) Never done   Cervical Cancer Screening (Pap smear)  Never done   COVID-19 Vaccine (1 - 2024-25 season) 06/27/2023 (Originally 02/14/2023)   INFLUENZA VACCINE  Completed   Hepatitis C Screening  Completed   HIV Screening  Completed    Discussed health benefits of physical activity, and encouraged her to engage in regular exercise appropriate for her age and condition.  Problem List Items Addressed This Visit       Respiratory   Mild intermittent asthma with exacerbation   Acute, likely viral URI underlying exacerbation.  SpO2 95% room air today.  Slightly tachycardic but blood pressure normal and at goal. Treat with albuterol nebulizer treatment in office today. Prescribed prednisone 40 mg every morning x 5 days. Patient was encouraged to pick up albuterol inhaler at home, will also prescribe nebulizer and nebulized albuterol solution that patient can use as needed. Patient told to symptoms persist an additional week to call office at which point may consider treatment with antibiotic therapy.  Also told that if symptoms worsen especially if she has more shortness of breath or wheezing over the next week she should proceed to the emergency department.      Relevant Medications   predniSONE (DELTASONE) 20 MG tablet   albuterol (PROVENTIL) (2.5 MG/3ML) 0.083% nebulizer solution  Other Relevant Orders   For home use only DME Nebulizer machine   POC COVID-19 BinaxNow (Completed)   POCT Influenza A/B (Completed)   POCT respiratory syncytial virus (Completed)     Musculoskeletal and Integument   Eczema   Chronic, currently flared Uses triamcinolone cream, however this is failed to control symptoms long-term.  Will try to get prior authorization for  Roflumilast cream as well as referred to dermatology for assistance with evaluation and management.  Proper use of roflumilast cream discussed today in office. Patient also being treated today with oral prednisone for asthma exacerbation, this may help with current rashes.      Relevant Medications   Roflumilast (ZORYVE) 0.15 % CREA   Other Relevant Orders   Ambulatory referral to Dermatology     Other   Screening examination for STI   Per patient request will check for HSV antibodies 1 and 2.      Relevant Orders   HSV 1 antibody, IgG   HSV-2 Ab, IgG   Cervical cancer screening   Patient's not sure last Pap smear date, probably overdue.  Referred to OB/GYN for cervical cancer screening ordered today.      Relevant Orders   Ambulatory referral to Obstetrics / Gynecology   Encounter for general adult medical examination with abnormal findings - Primary   Healthy lifestyle discussed, handout provided.      Return in about 6 weeks (around 07/23/2023) for F/U with Maralyn Sago.  In addition to annual physical exam also performed office visit as detailed above.     Elenore Paddy, NP

## 2023-06-11 NOTE — Assessment & Plan Note (Signed)
Healthy lifestyle discussed, handout provided.

## 2023-06-11 NOTE — Assessment & Plan Note (Signed)
Patient's not sure last Pap smear date, probably overdue.  Referred to OB/GYN for cervical cancer screening ordered today.

## 2023-06-12 LAB — HSV 1 ANTIBODY, IGG: HSV 1 Glycoprotein G Ab, IgG: <0.9 {index}

## 2023-06-12 LAB — HSV-2 AB, IGG: HSV 2 IgG, Type Spec: NONREACTIVE

## 2023-06-14 ENCOUNTER — Other Ambulatory Visit (HOSPITAL_COMMUNITY): Payer: Self-pay

## 2023-06-14 NOTE — Telephone Encounter (Signed)
Pharmacy Patient Advocate Encounter  Received notification from [facility] [employer] that Prior Authorization for Zoryve 0.15% cream has been DENIED.  See denial reason below. No denial letter attached in CMM. Will attach denial letter to Media tab once received.   PA #/Case ID/Reference #: [id number]     Tried a test claim for calcipotriene 0.005% cream (Dovonex) 60g and the copay came back as $4.

## 2023-06-16 DIAGNOSIS — Z419 Encounter for procedure for purposes other than remedying health state, unspecified: Secondary | ICD-10-CM | POA: Diagnosis not present

## 2023-06-29 ENCOUNTER — Encounter: Payer: Self-pay | Admitting: Nurse Practitioner

## 2023-07-03 ENCOUNTER — Other Ambulatory Visit: Payer: Self-pay | Admitting: Nurse Practitioner

## 2023-07-03 DIAGNOSIS — J4521 Mild intermittent asthma with (acute) exacerbation: Secondary | ICD-10-CM

## 2023-07-17 DIAGNOSIS — Z419 Encounter for procedure for purposes other than remedying health state, unspecified: Secondary | ICD-10-CM | POA: Diagnosis not present

## 2023-07-22 ENCOUNTER — Ambulatory Visit: Payer: Medicaid Other | Admitting: Nurse Practitioner

## 2023-07-23 ENCOUNTER — Ambulatory Visit: Payer: Medicaid Other | Admitting: Nurse Practitioner

## 2023-07-25 ENCOUNTER — Other Ambulatory Visit: Payer: Self-pay | Admitting: Nurse Practitioner

## 2023-07-25 DIAGNOSIS — L2084 Intrinsic (allergic) eczema: Secondary | ICD-10-CM

## 2023-07-26 ENCOUNTER — Encounter: Payer: Self-pay | Admitting: Nurse Practitioner

## 2023-07-26 MED ORDER — TRIAMCINOLONE ACETONIDE 0.1 % EX CREA: 1.0000 | TOPICAL_CREAM | Freq: Two times a day (BID) | CUTANEOUS | 0 refills | Status: DC

## 2023-07-27 ENCOUNTER — Other Ambulatory Visit: Payer: Self-pay | Admitting: Nurse Practitioner

## 2023-07-27 DIAGNOSIS — L2084 Intrinsic (allergic) eczema: Secondary | ICD-10-CM

## 2023-07-30 ENCOUNTER — Ambulatory Visit: Payer: Medicaid Other | Admitting: Nurse Practitioner

## 2023-07-31 ENCOUNTER — Encounter: Payer: Self-pay | Admitting: Nurse Practitioner

## 2023-08-01 ENCOUNTER — Encounter: Payer: Medicaid Other | Admitting: Nurse Practitioner

## 2023-08-01 ENCOUNTER — Encounter: Payer: Self-pay | Admitting: Nurse Practitioner

## 2023-08-01 ENCOUNTER — Ambulatory Visit: Payer: Medicaid Other

## 2023-08-01 DIAGNOSIS — L2084 Intrinsic (allergic) eczema: Secondary | ICD-10-CM

## 2023-08-01 NOTE — Progress Notes (Signed)
 I have spent 5 minutes in review of e-visit questionnaire, review and updating patient chart, medical decision making and response to patient.   Claiborne Rigg, NP

## 2023-08-01 NOTE — Progress Notes (Signed)
Thank you for submitting an e-visit  Because your eczema has become chronic and is requiring monthly management and recurring prescriptions of steroids we feel it is best you are seen in person, preferably by a dermatologist or your PCP.  Seeing dermatology may require a referral from your primary care doctor, but you are also open to calling dermatology offices yourself, unfortunately we are not providing referrals at this time.   I feel your condition warrants further evaluation and I recommend that you be seen for a face to face visit.  Please contact your primary care physician practice to be seen. Many offices offer virtual options to be seen via video if you are not comfortable going in person to a medical facility at this time.   NOTE: You will NOT be charged for this eVisit.   If you do not have a PCP, [facility] offers a free physician referral service available at [phone]. Our trained staff has the experience, knowledge and resources to put you in touch with a physician who is right for you.      If you are having a true medical emergency please call 911.     Your e-visit answers were reviewed by a board certified advanced clinical practitioner to complete your personal care plan.  Thank you

## 2023-08-01 NOTE — Progress Notes (Signed)
Amber Lyons is requesting prednisone. This was already discussed on her evisit. When she signed into video. There was no audio and I was unable to view her video. Camera was off. There was never a response or additional attempt to log back in.

## 2023-08-02 NOTE — Telephone Encounter (Signed)
 Please see below.  Thank you.

## 2023-08-03 ENCOUNTER — Ambulatory Visit: Payer: Self-pay | Admitting: Nurse Practitioner

## 2023-08-03 ENCOUNTER — Other Ambulatory Visit (HOSPITAL_COMMUNITY): Payer: Self-pay

## 2023-08-03 ENCOUNTER — Telehealth (INDEPENDENT_AMBULATORY_CARE_PROVIDER_SITE_OTHER): Payer: Medicaid Other | Admitting: Family Medicine

## 2023-08-03 DIAGNOSIS — L2084 Intrinsic (allergic) eczema: Secondary | ICD-10-CM

## 2023-08-03 DIAGNOSIS — R21 Rash and other nonspecific skin eruption: Secondary | ICD-10-CM

## 2023-08-03 MED ORDER — TRIAMCINOLONE ACETONIDE 0.1 % EX CREA
1.0000 | TOPICAL_CREAM | Freq: Two times a day (BID) | CUTANEOUS | 0 refills | Status: DC
  Filled 2023-08-03: qty 60, 15d supply, fill #0
  Filled 2023-08-16: qty 80, 30d supply, fill #0

## 2023-08-03 NOTE — Patient Instructions (Addendum)
-  I sent the medication(s) we discussed to your pharmacy: Meds ordered this encounter  Medications   triamcinolone cream (KENALOG) 0.1 %    Sig: Apply 1 Application topically 2 (two) times daily.    Dispense:  90 g    Refill:  0   Follow up in person with your doctor in the next 1-2 weeks  Your skin care regimen is so important:  Bathing: 5-10 minutes tops daily, lukewarm water, unscented/hypoallergenic wash such as cetaphil or cerave. Dilute bleach (1/4 cup per 20 gallons) bath for 5-10 minutes (lukewarm) 1-2 times per week - do not get in eyes. Rinse and then gently pat dry.   Emollient/Moisturizer: Unscented/Hypoallergenic emollient such as cerave cream or Eucerin Eczema cream twice daily.  Steroid: low potency steroid such as hydrocortisone cream twice daily on the face for flares, Triamcinolone 0.1% prescription cream twice daily on other areas for flares. If not working follow up promptly with your doctor.  For now avoid any other products or makeup on the skin and avoid the eyelash extensions.   I hope you are feeling better soon!   Seek in person care promptly if your symptoms worsen, new concerns arise or you are not improving promptly with treatment.  It was nice to meet you today. I help [facility] out with telemedicine visits on Tuesdays and Thursdays and am happy to help if you need a virtual follow up visit on those days. Otherwise, if you have any concerns or questions following this visit please schedule a follow up visit with your Primary Care office or seek care at a local urgent care clinic to avoid delays in care. If you are having severe or life threatening symptoms please call 911 and/or go to the nearest emergency room.     NUTRITION: -eat real food: lots of colorful vegetables (half the plate) and fruits -5-7 servings of vegetables and fruits per day (fresh or steamed is best), exp. 2 servings of vegetables with lunch and dinner and 2 servings of fruit per day.  Berries and greens such as kale and collards are great choices.  -drink at least 4-5 glasses of water daily -consume on a regular basis:  fresh fruits, fresh veggies, fish, nuts, seeds, healthy oils (such as olive oil, avocado oil), whole grains (make sure first ingredient on label contains the word "whole") -can eat small amounts of dairy and lean meat such as poultry (no larger than the palm of your hand), but avoid processed meats such as ham, bacon, lunch meat, etc. -drink water -try to avoid fast food and pre-packaged foods, processed meat, ultra processed foods (donuts, candy, etc.) -try to avoid foods and drinks with added sugar or sweeteners/sweets  -try to avoid foods that contain any ingredients with names you do not recognize  -try to avoid sweet drinks -try to avoid white rice, white bread, pasta (unless whole grain)

## 2023-08-03 NOTE — Telephone Encounter (Signed)
  Chief Complaint: Eczema Symptoms: itching, burning, inflammation specifically around eyes Frequency: Started around 02/05 Pertinent Negatives: Patient denies fever, N/V Disposition: [] ED /[] Urgent Care (no appt availability in office) / [x] Appointment(In office/virtual)/ []  [facility]/ [] Home Care/ [] Refused Recommended Disposition /[] [facility]/ []  Follow-up with PCP Additional Notes: Pt reports she has chronic eczema and most recently began experiencing a severe flare up to her face, pt notes her eyes are swollen shut in the morning, itching, burning and swollen. Pt denies swelling of the lips, tongue, throat, difficulty breathing or fever. Pt had OV scheduled tomorrow but was cx due to weather. Virtual visit scheduled for this PM. This RN educated pt on home care, new-worsening symptoms, when to call back/seek emergent care. Pt verbalized understanding and agrees to plan.    Copied from CRM [id number]. Topic: Clinical - Red Word Triage >> Aug 03, 2023  3:37 PM Adele Barthel wrote: Red Word that prompted transfer to Nurse Triage:   Eczema is severe and began flaring up around 2 weeks.  Has been evaluated by urgent care and was advised to see PCP or dermatology and that they are unable to help. Eczema is mainly around eyes Causing her eyes to swell shut.  Swelling is worse in the morning Skin is starting to darken where eczema was present. Reason for Disposition  Face becomes swollen  Answer Assessment - Initial Assessment Questions 1.) CALLER DIAGNOSIS: "What do you think is causing the rash?" (e.g., athlete's foot, chickenpox, hives, impetigo) Eczema  2.) LOCALIZED OR WIDESPREAD:  "Is the rash all over (widespread) or mostly just in one area of the body (localized)?"    3.) NEW MEDICINES: "Are you taking any new medicine?"  None 4.) APPEARANCE of RASH: "Describe the rash. What color is it?" (Note: It is difficult to assess rash color in people with  darker-colored skin. When this situation occurs, simply ask the caller to describe what they see.)  Inflamed, swollen, burning, itching, generalized but face is severe broken out  Protocols used: Rash - Guideline Selection-A-AH, Rash or Redness - [facility]

## 2023-08-03 NOTE — Progress Notes (Signed)
Virtual Visit via Video Note  I connected with Amber Lyons  on 08/03/23 at  6:20 PM EST by a video enabled telemedicine application and verified that I am speaking with the correct person using two identifiers.  Location patient: [place] Location provider:work or home office Persons participating in the virtual visit: patient, provider  I discussed the limitations and requested verbal permission for telemedicine visit. The patient expressed understanding and agreed to proceed.   HPI:  Acute telemedicine visit for Eczema: -Onset: in childhood, "my whole life" -Symptoms include: swelling and itching around the eyes - she initially thought was due to eyelash extensions because started after using, she has been taking some benadryl and is improving, also chronic eczema throughout her body (around the mouth, behind the ear, chest, sometime arms, hands and wrists and a patch on her L foot) dry and scaly, itchy  -she feels like eczema does seem top get worse in the winter and the summer -does not notice any foods that make it worse -management plan currently: shortened showers and lowered water temp recently since recetn urgent care visit as was taking long hot showers, olay body wash, uses eucerin cream sometimes - but not now on the face, triam cinilone cream works for her - but reports has had issues getting from the pharamcy. PCP prescribed 80mg  recently but pharmacy was out of it so they gave her a tiny tube (the size of her pinky) which was not very helpful -Denies: pus, drainage from lesions, fevers -Pertinent past medical history: see below -Pertinent medication allergies: Allergies  Allergen Reactions   Penicillins Hives and Swelling   -COVID-19 vaccine status:  Immunization History  Administered Date(s) Administered   Influenza, Seasonal, Injecte, Preservative Fre 04/14/2023   PPD Test 07/07/2021     ROS: See pertinent positives and negatives per HPI.  Past Medical History:  Diagnosis  Date   Asthma    Eczema     Past Surgical History:  Procedure Laterality Date   NO PAST SURGERIES       Current Outpatient Medications:    albuterol (PROVENTIL) (2.5 MG/3ML) 0.083% nebulizer solution, Take 3 mLs (2.5 mg total) by nebulization every 6 (six) hours as needed for wheezing or shortness of breath., Disp: 150 mL, Rfl: 1   Roflumilast (ZORYVE) 0.15 % CREA, Apply 1 Application topically daily., Disp: 60 g, Rfl: 1   triamcinolone cream (KENALOG) 0.1 %, Apply 1 Application topically 2 (two) times daily., Disp: 90 g, Rfl: 0   VENTOLIN HFA 108 (90 Base) MCG/ACT inhaler, TAKE 2 PUFFS BY MOUTH EVERY 6 HOURS AS NEEDED FOR WHEEZE OR SHORTNESS OF BREATH, Disp: 18 each, Rfl: 1  EXAM:  VITALS per patient if applicable:  GENERAL: alert, oriented, appears well and in no acute distress  SKIN: some mild erythema and edema eyelids, some mild patchy erythema near R corner mouth, some scaly, hyperpigmented rash on hands, patch on ankle  HEENT: atraumatic, conjunttiva clear, no obvious abnormalities on inspection of external nose and ears  NECK: normal movements of the head and neck  LUNGS: on inspection no signs of respiratory distress, breathing rate appears normal, no obvious gross SOB, gasping or wheezing  CV: no obvious cyanosis  MS: moves all visible extremities without noticeable abnormality  PSYCH/NEURO: pleasant and cooperative, no obvious depression or anxiety, speech and thought processing grossly intact  ASSESSMENT AND PLAN:  Discussed the following assessment and plan:  Skin rash  Intrinsic eczema - Plan: triamcinolone cream (KENALOG) 0.1 %  -we discussed  possible serious and likely etiologies, options for evaluation and workup, limitations of telemedicine visit vs in person visit, treatment, treatment risks and precautions. Pt is agreeable to treatment via telemedicine at this moment. Does appear to have eczema. Given reported severity at times advised dermatology care  and she reports she is in the process of getting dermatologist.  Discussed skin care/maintenance vs flare management plan at length: short lukewarm bathing no longer than 5-10 minutes per day with dilute bleach bath 5-10 minutes 1-2 times per week if mod-severe, hypoallergenic cerave or cetaphil unscented hypoallergenic wash only, topical emollient that is unscented and hypoallergenic (cetaphil, cerave cream or eucerin eczema cream twice a day religiously, no make up or extensions for now, low potency steroid bid on face (discussed options and she has decided to go with hydrocortisone cream 1.0% on the face), triamcinilone 0.1% bid elsewhere. Sent new rx to a different pharmacy as she has had issues getting rx at her pharmacy. Discussed other eczema treatments should these treatments fail. Discussed anti-inflammatory, healthy whole foods plant heavy diet and advised elimination of foods with added sweeteners and ultra processed foods.  Asked several times to ensure had information and understanding if she had any further questions or concerns. Advise to seek prompt virtual visit or in person care if worsening, new symptoms arise, or if is not improving with treatment as expected per our conversation of expected course. She reports she plans to follow up closely with her PCP. Did let this patient know that I do telemedicine on Tuesdays and Thursdays for [facility] [facility] and those are the days I am logged into the system. Advised to schedule follow up visit with PCP, [facility] virtual visits or [facility] if any further questions or concerns to avoid delays in care.   Spent over 25 minutes in consultation with this patient in counseling. I discussed the assessment and treatment plan with the patient. The patient was provided an opportunity to ask questions and all were answered. The patient agreed with the plan and demonstrated an understanding of the instructions.     Terressa Koyanagi, DO

## 2023-08-04 ENCOUNTER — Telehealth (INDEPENDENT_AMBULATORY_CARE_PROVIDER_SITE_OTHER): Payer: Medicaid Other | Admitting: Nurse Practitioner

## 2023-08-04 ENCOUNTER — Other Ambulatory Visit: Payer: Self-pay | Admitting: Nurse Practitioner

## 2023-08-04 ENCOUNTER — Telehealth: Payer: Self-pay | Admitting: Nurse Practitioner

## 2023-08-04 ENCOUNTER — Other Ambulatory Visit (HOSPITAL_COMMUNITY): Payer: Self-pay

## 2023-08-04 VITALS — Ht 66.0 in | Wt 229.0 lb

## 2023-08-04 DIAGNOSIS — L309 Dermatitis, unspecified: Secondary | ICD-10-CM | POA: Diagnosis not present

## 2023-08-04 DIAGNOSIS — E66812 Obesity, class 2: Secondary | ICD-10-CM | POA: Diagnosis not present

## 2023-08-04 DIAGNOSIS — Z6836 Body mass index (BMI) 36.0-36.9, adult: Secondary | ICD-10-CM

## 2023-08-04 NOTE — Assessment & Plan Note (Signed)
Chronic, currently flared Patient will obtain triamcinolone cream when she is able to get to the pharmacy.  Had previously attempted authorization for [facility] but this was denied by insurance.  I have ordered referral to dermatology for assistance in managing her eczema to office as requested by patient.

## 2023-08-04 NOTE — Progress Notes (Signed)
Established Patient Office Visit  An audio/visual tele-health visit was completed today for this patient. I connected with  Amber Lyons on 08/04/23 utilizing audio/visual technology and verified that I am speaking with the correct person using two identifiers. The patient was located at their home, and I was located at home during the encounter. I discussed the limitations of evaluation and management by telemedicine. The patient expressed understanding and agreed to proceed.     Subjective   Patient ID: Amber Lyons, female    DOB: May 17, 1996  Age: 28 y.o. MRN: [id number]  Chief Complaint  Patient presents with   Weight Loss    Patient wanted to talk about her skin on her face she states she has chronic eczema and it has been worse.. she is out of her cream requests for 80gram the larger amount. She wanting to talk about starting wegovy    Patient has a visit for the above.  Eczema: Had a telemedicine visit the other day with a different provider.  Has had triamcinolone cream sent to patient's pharmacy, she is waiting to go pick this up once the roads have cleared due to recent winter weather.  Overall she reports that eczema is flared on her eyes, ears, chest and has been quite uncomfortable.  She did discuss lifestyle modification with previous provider such as tepid baths and use of moisturizers to help manage her eczema as well.  She would like referral to [facility] dermatology as she called this office and they stated they could get her in quickly for assistance with also managing her eczema.  Obesity: Current height 5 6, current weight 229 pounds being her current BMI of 36.96.  Patient would like to discuss weight loss medication as she has tried and failed lifestyle modification alone in regards to sustain weight loss.  She has been able to lose weight in the past but has been unable to keep it off long-term.  She has comorbid condition including prediabetes.  She denies any personal or  family history of thyroid cancer, also denies personal history of pancreatitis or gallstones.  She is not on any contraception for birth control, but has not missed any menstrual cycles, last menstrual period started on 07/23/2023, she reports she is not currently attempting to conceive and does not plan to attempt to conceive in the near future.     ROS: see HPI    Objective:     Ht 5\' 6"  (1.676 m)   Wt 229 lb (103.9 kg)   LMP 07/23/2023   BMI 36.96 kg/m  Wt Readings from Last 3 Encounters:  08/04/23 229 lb (103.9 kg)  06/11/23 229 lb (103.9 kg)  04/14/23 229 lb (103.9 kg)      Physical Exam Comprehensive physical exam not completed today as office visit was conducted remotely.  Overall patient appeared well on video, no acute distress identified.  Patient was alert and oriented, and appeared to have appropriate judgment.   No results found for any visits on 08/04/23.    The ASCVD Risk score (Arnett DK, et al., 2019) failed to calculate for the following reasons:   The 2019 ASCVD risk score is only valid for ages 11 to 50    Assessment & Plan:   Problem List Items Addressed This Visit       Musculoskeletal and Integument   Eczema - Primary   Chronic, currently flared Patient will obtain triamcinolone cream when she is able to get to the pharmacy.  Had previously attempted authorization for [facility] but this was denied by insurance.  I have ordered referral to dermatology for assistance in managing her eczema to office as requested by patient.        Other   Class 2 obesity with body mass index (BMI) of 36.0 to 36.9 in adult   Chronic I do think she would be a good candidate for Wegovy to help her with weight loss.  We discussed medication including side effects and blackbox warnings.  We also discussed how to administer the medication, the importance of appropriate needle disposal, the importance of injection site rotation, and signs of side effects that would require  notification of myself.  We also discussed the importance of preventing pregnancy and discussion with OB/GYN regarding how long patient would have to be off of medication before she should attempt to conceive if she wishes to conceive in the future.  She reports her understanding. Will request prior authorization from insurance company, pending response will send in prescription.  Patient to follow back up with me in 4 to 6 weeks to discuss how she is tolerating the medication.        Return in about 6 weeks (around 09/15/2023) for F/U with Maralyn Sago.    Elenore Paddy, NP

## 2023-08-04 NOTE — Assessment & Plan Note (Signed)
Chronic I do think she would be a good candidate for Wegovy to help her with weight loss.  We discussed medication including side effects and blackbox warnings.  We also discussed how to administer the medication, the importance of appropriate needle disposal, the importance of injection site rotation, and signs of side effects that would require notification of myself.  We also discussed the importance of preventing pregnancy and discussion with OB/GYN regarding how long patient would have to be off of medication before she should attempt to conceive if she wishes to conceive in the future.  She reports her understanding. Will request prior authorization from insurance company, pending response will send in prescription.  Patient to follow back up with me in 4 to 6 weeks to discuss how she is tolerating the medication.

## 2023-08-04 NOTE — Telephone Encounter (Signed)
Please have prior auth team attempt prior authorization for [facility] for treatment of weight loss. Please let me know insurance response as I will wait to send prescription in to pharmacy until we hear back from insurance.

## 2023-08-06 ENCOUNTER — Telehealth: Payer: Self-pay

## 2023-08-06 ENCOUNTER — Other Ambulatory Visit (HOSPITAL_COMMUNITY): Payer: Self-pay

## 2023-08-06 NOTE — Telephone Encounter (Signed)
Pharmacy Patient Advocate Encounter   Received notification from Pt Calls Messages that prior authorization for Wegovy 0.25mg /0.94ml is required/requested.   Insurance verification completed.   The patient is insured through [facility] [place] .   Per test claim: PA required; PA submitted to above mentioned insurance via CoverMyMeds Key/confirmation #/EOC [id number] Status is pending

## 2023-08-06 NOTE — Telephone Encounter (Signed)
PA request has been approved, should be $4.00 at pt's pharmacy

## 2023-08-06 NOTE — Telephone Encounter (Signed)
Pharmacy Patient Advocate Encounter  Received notification from [facility] that Prior Authorization for Amber Lyons has been APPROVED from 08/06/2023 to 02/02/2024. Ran test claim, Copay is $4.00. This test claim was processed through [facility]- copay amounts may vary at other pharmacies due to pharmacy/plan contracts, or as the patient moves through the different stages of their insurance plan.   PA #/Case ID/Reference #: [id number]

## 2023-08-10 ENCOUNTER — Other Ambulatory Visit: Payer: Self-pay | Admitting: Nurse Practitioner

## 2023-08-10 ENCOUNTER — Other Ambulatory Visit (HOSPITAL_COMMUNITY): Payer: Self-pay

## 2023-08-10 DIAGNOSIS — Z6836 Body mass index (BMI) 36.0-36.9, adult: Secondary | ICD-10-CM

## 2023-08-10 MED ORDER — WEGOVY 0.25 MG/0.5ML ~~LOC~~ SOAJ
0.2500 mg | SUBCUTANEOUS | 0 refills | Status: DC
  Filled 2023-08-10: qty 2, 28d supply, fill #0

## 2023-08-14 DIAGNOSIS — Z419 Encounter for procedure for purposes other than remedying health state, unspecified: Secondary | ICD-10-CM | POA: Diagnosis not present

## 2023-08-16 ENCOUNTER — Other Ambulatory Visit (HOSPITAL_COMMUNITY): Payer: Self-pay

## 2023-09-16 ENCOUNTER — Ambulatory Visit: Payer: Medicaid Other | Admitting: Nurse Practitioner

## 2023-09-17 ENCOUNTER — Encounter: Payer: Self-pay | Admitting: Nurse Practitioner

## 2023-09-17 ENCOUNTER — Ambulatory Visit (INDEPENDENT_AMBULATORY_CARE_PROVIDER_SITE_OTHER): Admitting: Nurse Practitioner

## 2023-09-17 VITALS — BP 100/66 | HR 81 | Temp 97.6°F | Ht 66.0 in | Wt 220.2 lb

## 2023-09-17 DIAGNOSIS — L2084 Intrinsic (allergic) eczema: Secondary | ICD-10-CM | POA: Diagnosis not present

## 2023-09-17 DIAGNOSIS — E66812 Obesity, class 2: Secondary | ICD-10-CM | POA: Diagnosis not present

## 2023-09-17 DIAGNOSIS — J4521 Mild intermittent asthma with (acute) exacerbation: Secondary | ICD-10-CM | POA: Diagnosis not present

## 2023-09-17 DIAGNOSIS — Z6836 Body mass index (BMI) 36.0-36.9, adult: Secondary | ICD-10-CM

## 2023-09-17 DIAGNOSIS — Z0100 Encounter for examination of eyes and vision without abnormal findings: Secondary | ICD-10-CM | POA: Insufficient documentation

## 2023-09-17 MED ORDER — WEGOVY 0.5 MG/0.5ML ~~LOC~~ SOAJ: 0.5000 mg | SUBCUTANEOUS | 0 refills | Status: DC

## 2023-09-17 MED ORDER — ALBUTEROL SULFATE HFA 108 (90 BASE) MCG/ACT IN AERS
1.0000 | INHALATION_SPRAY | Freq: Four times a day (QID) | RESPIRATORY_TRACT | 2 refills | Status: AC | PRN
Start: 2023-09-17 — End: ?

## 2023-09-17 MED ORDER — WEGOVY 0.5 MG/0.5ML ~~LOC~~ SOAJ: 0.5000 mg | SUBCUTANEOUS | 1 refills | Status: DC

## 2023-09-17 MED ORDER — TRIAMCINOLONE ACETONIDE 0.1 % EX CREA: 1.0000 | TOPICAL_CREAM | Freq: Two times a day (BID) | CUTANEOUS | 1 refills | Status: DC

## 2023-09-17 NOTE — Progress Notes (Signed)
 Established Patient Office Visit  Subjective   Patient ID: Amber Lyons, female    DOB: 1995-12-13  Age: 28 y.o. MRN: [id number]  Chief Complaint  Patient presents with   Medical Management of Chronic Issues    Follow on weight loss medication, been experiencing headache for the last three days     Obesity: Here for obesity management follow-up.  She has been prescribed Wegovy 0.25 mg injection weekly.  She has completed 4 doses, she has missed a dose because she ran out of medication and waited until today to discuss refilling.  Since being on the medication she has tolerated it well and denies any abdominal pain, nausea, vomiting, dysphagia/pressure to her throat.  Eczema: Recently ran out of her triamcinolone cream that she uses as needed to manage eczema, she is requesting refill today.  Asthma: Is requesting refill of albuterol inhaler.  Eye exam: Reports she broke her glasses recently and is due for eye exam.  She would like referral to optometry.    ROS: see HPI    Objective:     BP 100/66   Pulse 81   Temp 97.6 F (36.4 C) (Temporal)   Ht 5\' 6"  (1.676 m)   Wt 220 lb 4 oz (99.9 kg)   LMP 09/13/2023   SpO2 95%   BMI 35.55 kg/m  BP Readings from Last 3 Encounters:  09/17/23 100/66  06/11/23 126/74  04/14/23 108/64   Wt Readings from Last 3 Encounters:  09/17/23 220 lb 4 oz (99.9 kg)  08/04/23 229 lb (103.9 kg)  06/11/23 229 lb (103.9 kg)      Physical Exam Vitals reviewed.  Constitutional:      General: She is not in acute distress.    Appearance: Normal appearance.  HENT:     Head: Normocephalic and atraumatic.  Cardiovascular:     Rate and Rhythm: Normal rate and regular rhythm.     Pulses: Normal pulses.     Heart sounds: Normal heart sounds.  Pulmonary:     Effort: Pulmonary effort is normal.     Breath sounds: Normal breath sounds.  Skin:    General: Skin is warm and dry.  Neurological:     General: No focal deficit present.     Mental  Status: She is alert and oriented to person, place, and time.  Psychiatric:        Mood and Affect: Mood normal.        Behavior: Behavior normal.        Judgment: Judgment normal.      No results found for any visits on 09/17/23.    The ASCVD Risk score (Arnett DK, et al., 2019) failed to calculate for the following reasons:   The 2019 ASCVD risk score is only valid for ages 56 to 62    Assessment & Plan:   Problem List Items Addressed This Visit       Respiratory   Mild intermittent asthma with exacerbation   Chronic, stable Refill for albuterol inhaler sent to pharmacy.      Relevant Medications   albuterol (VENTOLIN HFA) 108 (90 Base) MCG/ACT inhaler     Musculoskeletal and Integument   Eczema   Chronic, stable Refill for triamcinolone sent to pharmacy today      Relevant Medications   triamcinolone cream (KENALOG) 0.1 %     Other   Class 2 obesity with body mass index (BMI) of 36.0 to 36.9 in adult - Primary  Chronic Starting weight 229 pounds, starting BMI 36.96 Current weight 220 pounds, BMI 35.55 Tolerating Wegovy well.  Last menstrual cycle started earlier this week. Will plan on increasing Wegovy to 0.5 mg/week.  I did discuss that if she misses a dose more than 2 weeks and she has to start over at the initial dose.  She reports her understanding.  Thus, she was told to reach out to me when she is down to 1 injection to let me know how she is tolerating the medication before I am able to approve a refill.  She reports understanding. Follow-up in 6 weeks or sooner as needed.      Relevant Medications   Semaglutide-Weight Management (WEGOVY) 0.5 MG/0.5ML SOAJ   Eye exam, routine   Referral to optometry ordered today      Relevant Orders   Ambulatory referral to Optometry    Return in about 7 weeks (around 11/04/2023) for F/U with Maralyn Sago.    Elenore Paddy, NP

## 2023-09-17 NOTE — Assessment & Plan Note (Deleted)
 Starting weight 229 pounds, starting BMI 36.96 08/2023

## 2023-09-17 NOTE — Assessment & Plan Note (Signed)
 Chronic, stable Refill for triamcinolone sent to pharmacy today

## 2023-09-17 NOTE — Assessment & Plan Note (Signed)
 Referral to optometry ordered today

## 2023-09-17 NOTE — Assessment & Plan Note (Signed)
 Chronic Starting weight 229 pounds, starting BMI 36.96 Current weight 220 pounds, BMI 35.55 Tolerating Wegovy well.  Last menstrual cycle started earlier this week. Will plan on increasing Wegovy to 0.5 mg/week.  I did discuss that if she misses a dose more than 2 weeks and she has to start over at the initial dose.  She reports her understanding.  Thus, she was told to reach out to me when she is down to 1 injection to let me know how she is tolerating the medication before I am able to approve a refill.  She reports understanding. Follow-up in 6 weeks or sooner as needed.

## 2023-09-17 NOTE — Assessment & Plan Note (Signed)
 Chronic, stable Refill for albuterol inhaler sent to pharmacy.

## 2023-09-20 ENCOUNTER — Other Ambulatory Visit: Payer: Self-pay

## 2023-09-20 ENCOUNTER — Other Ambulatory Visit (HOSPITAL_COMMUNITY): Payer: Self-pay

## 2023-09-20 DIAGNOSIS — L2084 Intrinsic (allergic) eczema: Secondary | ICD-10-CM

## 2023-09-20 DIAGNOSIS — Z6836 Body mass index (BMI) 36.0-36.9, adult: Secondary | ICD-10-CM

## 2023-09-20 MED ORDER — TRIAMCINOLONE ACETONIDE 0.1 % EX CREA: 1.0000 | TOPICAL_CREAM | Freq: Two times a day (BID) | CUTANEOUS | 1 refills | Status: DC

## 2023-09-20 MED ORDER — WEGOVY 0.5 MG/0.5ML ~~LOC~~ SOAJ
0.5000 mg | SUBCUTANEOUS | 1 refills | Status: DC
  Filled 2023-09-20: qty 2, 28d supply, fill #0
  Filled 2023-10-06: qty 2, 28d supply, fill #1

## 2023-09-25 DIAGNOSIS — Z419 Encounter for procedure for purposes other than remedying health state, unspecified: Secondary | ICD-10-CM | POA: Diagnosis not present

## 2023-10-06 ENCOUNTER — Other Ambulatory Visit (HOSPITAL_COMMUNITY): Payer: Self-pay

## 2023-10-11 ENCOUNTER — Other Ambulatory Visit: Payer: Self-pay | Admitting: Nurse Practitioner

## 2023-10-11 ENCOUNTER — Other Ambulatory Visit (HOSPITAL_COMMUNITY): Payer: Self-pay

## 2023-10-11 DIAGNOSIS — E66812 Obesity, class 2: Secondary | ICD-10-CM

## 2023-10-11 MED ORDER — WEGOVY 1 MG/0.5ML ~~LOC~~ SOAJ
1.0000 mg | SUBCUTANEOUS | 0 refills | Status: DC
  Filled 2023-10-11 – 2023-10-18 (×2): qty 2, 28d supply, fill #0

## 2023-10-15 ENCOUNTER — Other Ambulatory Visit: Payer: Self-pay | Admitting: Nurse Practitioner

## 2023-10-15 DIAGNOSIS — L2084 Intrinsic (allergic) eczema: Secondary | ICD-10-CM

## 2023-10-15 MED ORDER — TRIAMCINOLONE ACETONIDE 0.1 % EX CREA: 1.0000 | TOPICAL_CREAM | Freq: Two times a day (BID) | CUTANEOUS | 1 refills | Status: DC

## 2023-10-18 ENCOUNTER — Other Ambulatory Visit: Payer: Self-pay | Admitting: Nurse Practitioner

## 2023-10-18 ENCOUNTER — Other Ambulatory Visit: Payer: Self-pay

## 2023-10-18 ENCOUNTER — Other Ambulatory Visit (HOSPITAL_COMMUNITY): Payer: Self-pay

## 2023-10-18 DIAGNOSIS — L2084 Intrinsic (allergic) eczema: Secondary | ICD-10-CM

## 2023-10-18 MED ORDER — TRIAMCINOLONE ACETONIDE 0.1 % EX CREA
1.0000 | TOPICAL_CREAM | Freq: Two times a day (BID) | CUTANEOUS | 1 refills | Status: DC
Start: 2023-10-18 — End: 2023-11-18
  Filled 2023-10-18: qty 80, 30d supply, fill #0
  Filled 2023-11-18: qty 454, 227d supply, fill #0
  Filled 2023-11-18: qty 454, 90d supply, fill #0

## 2023-10-18 MED ORDER — TRIAMCINOLONE ACETONIDE 0.1 % EX CREA
1.0000 | TOPICAL_CREAM | Freq: Two times a day (BID) | CUTANEOUS | 1 refills | Status: DC
  Filled 2023-10-18: qty 80, 40d supply, fill #0

## 2023-10-25 DIAGNOSIS — Z419 Encounter for procedure for purposes other than remedying health state, unspecified: Secondary | ICD-10-CM | POA: Diagnosis not present

## 2023-11-18 ENCOUNTER — Encounter: Payer: Self-pay | Admitting: Nurse Practitioner

## 2023-11-18 ENCOUNTER — Other Ambulatory Visit (HOSPITAL_COMMUNITY): Payer: Self-pay

## 2023-11-18 ENCOUNTER — Other Ambulatory Visit: Payer: Self-pay

## 2023-11-18 DIAGNOSIS — L2084 Intrinsic (allergic) eczema: Secondary | ICD-10-CM

## 2023-11-18 MED ORDER — TRIAMCINOLONE ACETONIDE 0.1 % EX CREA
1.0000 | TOPICAL_CREAM | Freq: Two times a day (BID) | CUTANEOUS | 1 refills | Status: DC
Start: 2023-11-18 — End: 2024-02-04
  Filled 2023-11-18: qty 454, 34d supply, fill #0
  Filled 2023-12-27: qty 454, 34d supply, fill #1

## 2023-11-19 ENCOUNTER — Other Ambulatory Visit (HOSPITAL_COMMUNITY): Payer: Self-pay

## 2023-11-19 ENCOUNTER — Other Ambulatory Visit: Payer: Self-pay | Admitting: Family

## 2023-11-19 DIAGNOSIS — Z6836 Body mass index (BMI) 36.0-36.9, adult: Secondary | ICD-10-CM

## 2023-11-19 MED ORDER — WEGOVY 1 MG/0.5ML ~~LOC~~ SOAJ
1.0000 mg | SUBCUTANEOUS | 1 refills | Status: DC
  Filled 2023-11-19: qty 2, 28d supply, fill #0
  Filled 2023-12-27: qty 2, 28d supply, fill #1

## 2023-11-23 ENCOUNTER — Other Ambulatory Visit (HOSPITAL_COMMUNITY): Payer: Self-pay

## 2023-11-25 DIAGNOSIS — Z419 Encounter for procedure for purposes other than remedying health state, unspecified: Secondary | ICD-10-CM | POA: Diagnosis not present

## 2023-12-25 DIAGNOSIS — Z419 Encounter for procedure for purposes other than remedying health state, unspecified: Secondary | ICD-10-CM | POA: Diagnosis not present

## 2024-01-25 ENCOUNTER — Other Ambulatory Visit: Payer: Self-pay | Admitting: Family

## 2024-01-25 DIAGNOSIS — L2084 Intrinsic (allergic) eczema: Secondary | ICD-10-CM

## 2024-01-25 DIAGNOSIS — Z6836 Body mass index (BMI) 36.0-36.9, adult: Secondary | ICD-10-CM

## 2024-01-25 DIAGNOSIS — Z419 Encounter for procedure for purposes other than remedying health state, unspecified: Secondary | ICD-10-CM | POA: Diagnosis not present

## 2024-01-25 DIAGNOSIS — E66812 Obesity, class 2: Secondary | ICD-10-CM

## 2024-02-01 ENCOUNTER — Other Ambulatory Visit: Payer: Self-pay | Admitting: Family

## 2024-02-01 ENCOUNTER — Other Ambulatory Visit (HOSPITAL_COMMUNITY): Payer: Self-pay

## 2024-02-01 DIAGNOSIS — L2084 Intrinsic (allergic) eczema: Secondary | ICD-10-CM

## 2024-02-01 DIAGNOSIS — E66812 Obesity, class 2: Secondary | ICD-10-CM

## 2024-02-03 ENCOUNTER — Encounter: Payer: Self-pay | Admitting: Nurse Practitioner

## 2024-02-03 ENCOUNTER — Other Ambulatory Visit (HOSPITAL_COMMUNITY): Payer: Self-pay

## 2024-02-04 ENCOUNTER — Telehealth: Payer: Self-pay

## 2024-02-04 ENCOUNTER — Other Ambulatory Visit: Payer: Self-pay | Admitting: Family

## 2024-02-04 ENCOUNTER — Other Ambulatory Visit: Payer: Self-pay | Admitting: Nurse Practitioner

## 2024-02-04 ENCOUNTER — Encounter (HOSPITAL_COMMUNITY): Payer: Self-pay

## 2024-02-04 ENCOUNTER — Other Ambulatory Visit (HOSPITAL_COMMUNITY): Payer: Self-pay

## 2024-02-04 DIAGNOSIS — Z6836 Body mass index (BMI) 36.0-36.9, adult: Secondary | ICD-10-CM

## 2024-02-04 DIAGNOSIS — L2084 Intrinsic (allergic) eczema: Secondary | ICD-10-CM

## 2024-02-04 MED ORDER — WEGOVY 1 MG/0.5ML ~~LOC~~ SOAJ
1.0000 mg | SUBCUTANEOUS | 1 refills | Status: DC
  Filled 2024-02-04 – 2024-02-07 (×2): qty 2, 28d supply, fill #0

## 2024-02-04 MED ORDER — TRIAMCINOLONE ACETONIDE 0.1 % EX CREA
1.0000 | TOPICAL_CREAM | Freq: Two times a day (BID) | CUTANEOUS | 1 refills | Status: DC
  Filled 2024-02-04 – 2024-03-02 (×2): qty 454, 34d supply, fill #0
  Filled 2024-05-14: qty 454, 34d supply, fill #1

## 2024-02-04 NOTE — Telephone Encounter (Signed)
 Copied from CRM #[id number]. Topic: Clinical - Medication Question >> Feb 04, 2024 10:45 AM Revonda D wrote:  Reason for CRM: Pt stated that she and the pharmacy requested the a medication refill the triamcinolone  cream (KENALOG ) 0.1 %, and the Semaglutide -Weight Management (WEGOVY ) 1 MG/0.5ML SOAJ. Pt stated that the pharmacy hasn't received a response and that someone informed her that it was being refilled on 8.19 but she didn't see that info in MyChart. Pt would like for the medication to be refilled today and receive a callback with an update.

## 2024-02-04 NOTE — Telephone Encounter (Signed)
 Copied from CRM [id number]. Topic: Clinical - Medication Refill >> Feb 04, 2024 10:42 AM Aisha D wrote: Medication: Semaglutide -Weight Management (WEGOVY ) 1 MG/0.5ML SOAJ , triamcinolone  cream (KENALOG ) 0.1 %  Has the patient contacted their pharmacy? Yes (Agent: If no, request that the patient contact the pharmacy for the refill. If patient does not wish to contact the pharmacy document the reason why and proceed with request.) (Agent: If yes, when and what did the pharmacy advise?)  This is the patient's preferred pharmacy:  [facility] [address], [address] [place] [address] Phone: [phone] Fax: [phone]  Is this the correct pharmacy for this prescription? Yes If no, delete pharmacy and type the correct one.   Has the prescription been filled recently? No  Is the patient out of the medication? Yes  Has the patient been seen for an appointment in the last year OR does the patient have an upcoming appointment? Yes  Can we respond through MyChart? Yes  Agent: Please be advised that Rx refills may take up to 3 business days. We ask that you follow-up with your pharmacy.

## 2024-02-07 ENCOUNTER — Telehealth: Payer: Self-pay

## 2024-02-07 ENCOUNTER — Other Ambulatory Visit (HOSPITAL_COMMUNITY): Payer: Self-pay

## 2024-02-07 NOTE — Telephone Encounter (Unsigned)
 Copied from CRM [id number]. Topic: Clinical - Medication Prior Auth >> Feb 07, 2024  3:52 PM Robinson H wrote: Reason for CRM: Patient states she was told by pharmacy [facility] pharmacy that she needs a prior authorization for the semaglutide -weight management (WEGOVY ) 1 MG/0.5ML SOAJ SQ injection.  Amber Lyons [phone]

## 2024-02-09 NOTE — Telephone Encounter (Signed)
 Copied from CRM [id number]. Topic: Clinical - Medication Prior Auth >> Feb 09, 2024 11:17 AM Chiquita SQUIBB wrote: Reason for CRM: Patient is calling in requesting a prior authorization for the Wegovy  medication, patient stated the insurance company would be faxing it over. Please advise patient on any update.     Mychart message was send to pt about this issue.

## 2024-02-09 NOTE — Telephone Encounter (Signed)
 Made pt aware that for the PA to be completed, she will need an office visit.

## 2024-02-11 NOTE — Telephone Encounter (Unsigned)
 Copied from CRM [id number]. Topic: Clinical - Medication Prior Auth >> Feb 09, 2024 11:17 AM Chiquita SQUIBB wrote: Reason for CRM: Patient is calling in requesting a prior authorization for the Wegovy  medication, patient stated the insurance company would be faxing it over. Please advise patient on any update. >> Feb 11, 2024 10:39 AM Armenia J wrote: Schuyler from [facility] Help Desk wanting to ask the patient's provider to send a prior authorization for Wegovy .   CoverMyMeds is blocking authorization which is populating the denial. This prior authorization needs to be faxed directly to [facility] Medicaid: [id number]

## 2024-02-11 NOTE — Telephone Encounter (Signed)
 Called pt and left detail message for her to call our office and schedule an appointment as documentation is needed for a PA to be done per her insurance.

## 2024-02-15 ENCOUNTER — Ambulatory Visit: Admitting: Family Medicine

## 2024-02-15 ENCOUNTER — Other Ambulatory Visit (HOSPITAL_COMMUNITY): Payer: Self-pay

## 2024-02-24 ENCOUNTER — Telehealth: Payer: Self-pay | Admitting: Nurse Practitioner

## 2024-02-24 ENCOUNTER — Ambulatory Visit: Admitting: Nurse Practitioner

## 2024-02-24 ENCOUNTER — Other Ambulatory Visit (HOSPITAL_COMMUNITY): Payer: Self-pay

## 2024-02-24 ENCOUNTER — Telehealth: Payer: Self-pay

## 2024-02-24 VITALS — BP 122/74 | HR 73 | Temp 97.8°F | Ht 66.0 in | Wt 194.4 lb

## 2024-02-24 DIAGNOSIS — E66812 Obesity, class 2: Secondary | ICD-10-CM | POA: Diagnosis not present

## 2024-02-24 DIAGNOSIS — Z113 Encounter for screening for infections with a predominantly sexual mode of transmission: Secondary | ICD-10-CM

## 2024-02-24 DIAGNOSIS — Z6836 Body mass index (BMI) 36.0-36.9, adult: Secondary | ICD-10-CM | POA: Diagnosis not present

## 2024-02-24 LAB — URINALYSIS, ROUTINE W REFLEX MICROSCOPIC
Bilirubin Urine: NEGATIVE
Ketones, ur: NEGATIVE
Nitrite: NEGATIVE
Specific Gravity, Urine: 1.025 (ref 1.000–1.030)
Urine Glucose: NEGATIVE
Urobilinogen, UA: 0.2 (ref 0.0–1.0)
pH: 6 (ref 5.0–8.0)

## 2024-02-24 LAB — POCT URINE PREGNANCY: Preg Test, Ur: NEGATIVE

## 2024-02-24 LAB — HCG, SERUM, QUALITATIVE: Preg, Serum: NEGATIVE

## 2024-02-24 MED ORDER — WEGOVY 0.25 MG/0.5ML ~~LOC~~ SOAJ
0.2500 mg | SUBCUTANEOUS | 0 refills | Status: DC
  Filled 2024-02-24: qty 2, 28d supply, fill #0

## 2024-02-24 NOTE — Progress Notes (Signed)
 Established Patient Office Visit  Subjective   Patient ID: Amber Lyons, female    DOB: 03/07/1996  Age: 28 y.o. MRN: [id number]  Chief Complaint  Patient presents with   Obesity    Discussed the use of AI scribe software for clinical note transcription with the patient, who gave verbal consent to proceed.  History of Present Illness Amber Lyons is a 28 year old female who presents for follow-up regarding her weight management on Wegovy .  Obesity and weight management - Body mass index was 36.9 at initiation of Wegovy  therapy, with a starting weight of 229 pounds. - Current weight is 194 pounds, reflecting significant weight loss while on Wegovy . -Ran out of medication and has been out of it for 4 weeks, last prescription for 1mg /week - Gained 8 pounds since discontinuing Wegovy , attributed to increased eating. - Improved breathing and increased energy levels with weight loss. -Not utilizing contraception  Pharmacologic therapy interruption - Wegovy  discontinued for almost one month due to insurance authorization issues. - Previous appointment was canceled, delaying in-person follow-up required for medication approval. - Will require restarting Wegovy  at a lower dose due to lapse in therapy.       ROS: see HPI    Objective:     BP 122/74   Pulse 73   Temp 97.8 F (36.6 C) (Temporal)   Ht 5' 6 (1.676 m)   Wt 194 lb 6 oz (88.2 kg)   LMP 02/04/2024   SpO2 95%   BMI 31.37 kg/m  BP Readings from Last 3 Encounters:  02/24/24 122/74  09/17/23 100/66  06/11/23 126/74   Wt Readings from Last 3 Encounters:  02/24/24 194 lb 6 oz (88.2 kg)  09/17/23 220 lb 4 oz (99.9 kg)  08/04/23 229 lb (103.9 kg)      Physical Exam Vitals reviewed.  Constitutional:      General: She is not in acute distress.    Appearance: Normal appearance.  HENT:     Head: Normocephalic and atraumatic.  Neck:     Vascular: No carotid bruit.  Cardiovascular:     Rate and Rhythm:  Normal rate and regular rhythm.     Pulses: Normal pulses.     Heart sounds: Normal heart sounds.  Pulmonary:     Effort: Pulmonary effort is normal.     Breath sounds: Normal breath sounds.  Skin:    General: Skin is warm and dry.  Neurological:     General: No focal deficit present.     Mental Status: She is alert and oriented to person, place, and time.  Psychiatric:        Mood and Affect: Mood normal.        Behavior: Behavior normal.        Judgment: Judgment normal.      Results for orders placed or performed in visit on 02/24/24  POCT urine pregnancy  Result Value Ref Range   Preg Test, Ur Negative Negative      The ASCVD Risk score (Arnett DK, et al., 2019) failed to calculate for the following reasons:   The 2019 ASCVD risk score is only valid for ages 50 to 68    Assessment & Plan:   Problem List Items Addressed This Visit       Other   Class 2 obesity with body mass index (BMI) of 36.0 to 36.9 in adult - Primary   Class 2 obesity with BMI 36.9 in adult Class 2 obesity with  initial BMI 36.9. Weight reduced from 229 lbs to 194 lbs with Wegovy . Recent 8 lb gain due to medication lapse. - Restart Wegovy  at 0.25 mg to avoid severe side effects. - Send prescription for Wegovy  0.25 mg to pharmacy. - Send note to prior authorization team for Wegovy  approval. - Perform urine pregnancy test before restarting Wegovy . (Negative today) - Discuss pregnancy prevention methods while on Wegovy  and the importance of contraception.  - Advised to stop Wegovy  three months before conception and immediately if pregnant. - Schedule monthly follow-ups until reaching 1.7 mg maintenance dose. - Perform STD screening with swab and blood tests for HIV and syphilis.      Relevant Medications   semaglutide -weight management (WEGOVY ) 0.25 MG/0.5ML SOAJ SQ injection   Other Relevant Orders   POCT urine pregnancy (Completed)   Other Visit Diagnoses       Routine screening for STI  (sexually transmitted infection)       Relevant Orders   NuSwab Vaginitis Plus (VG+)   RPR   HIV antibody (with reflex)   hCG, serum, qualitative   Urinalysis, Routine w reflex microscopic   Urine Culture   POCT urine pregnancy (Completed)      Assessment and Plan Assessment & Plan Class 2 obesity with BMI 36.9 in adult Class 2 obesity with initial BMI 36.9. Weight reduced from 229 lbs to 194 lbs with Wegovy . Recent 8 lb gain due to medication lapse. - Restart Wegovy  at 0.25 mg to avoid severe side effects. - Send prescription for Wegovy  0.25 mg to pharmacy. - Send note to prior authorization team for Wegovy  approval. - Perform urine pregnancy test before restarting Wegovy . (Negative today) - Discuss pregnancy prevention methods while on Wegovy  and the importance of contraception.  - Advised to stop Wegovy  three months before conception and immediately if pregnant. - Schedule monthly follow-ups until reaching 1.7 mg maintenance dose. - Perform STD screening with swab and blood tests for HIV and syphilis.   Return in about 3 weeks (around 03/16/2024) for F/U with Amber.    Amber FORBES Pereyra, NP

## 2024-02-24 NOTE — Telephone Encounter (Signed)
 Please start prior auth for wegovy  for obesity. Starting BMI 36.96

## 2024-02-24 NOTE — Assessment & Plan Note (Signed)
 Class 2 obesity with BMI 36.9 in adult Class 2 obesity with initial BMI 36.9. Weight reduced from 229 lbs to 194 lbs with Wegovy . Recent 8 lb gain due to medication lapse. - Restart Wegovy  at 0.25 mg to avoid severe side effects. - Send prescription for Wegovy  0.25 mg to pharmacy. - Send note to prior authorization team for Wegovy  approval. - Perform urine pregnancy test before restarting Wegovy . (Negative today) - Discuss pregnancy prevention methods while on Wegovy  and the importance of contraception.  - Advised to stop Wegovy  three months before conception and immediately if pregnant. - Schedule monthly follow-ups until reaching 1.7 mg maintenance dose. - Perform STD screening with swab and blood tests for HIV and syphilis.

## 2024-02-24 NOTE — Telephone Encounter (Signed)
 Pharmacy Patient Advocate Encounter   Received notification from Pt Calls Messages that prior authorization for Wegovy  0.25mg /0.58ml is required/requested.   Insurance verification completed.   The patient is insured through [facility] MEDICAID .   Per test claim: PA required; PA submitted to above mentioned insurance via Latent Key/confirmation #/EOC [id number] Status is pending

## 2024-02-24 NOTE — Addendum Note (Signed)
 Addended by: CLAUDENE BOBBETTE RAMAN on: 02/24/2024 04:29 PM   Modules accepted: Orders

## 2024-02-25 ENCOUNTER — Other Ambulatory Visit (HOSPITAL_COMMUNITY): Payer: Self-pay

## 2024-02-25 DIAGNOSIS — Z419 Encounter for procedure for purposes other than remedying health state, unspecified: Secondary | ICD-10-CM | POA: Diagnosis not present

## 2024-02-25 LAB — URINE CULTURE

## 2024-02-25 LAB — HIV ANTIBODY (ROUTINE TESTING W REFLEX)
HIV 1&2 Ab, 4th Generation: NONREACTIVE
HIV FINAL INTERPRETATION: NEGATIVE

## 2024-02-25 LAB — RPR: RPR Ser Ql: NONREACTIVE

## 2024-02-25 NOTE — Telephone Encounter (Signed)
 Pharmacy Patient Advocate Encounter  Received notification from [facility] MEDICAID that Prior Authorization for Wegovy  0.25mg /0.79ml has been APPROVED from 02/24/24 to 02/23/25   PA #/Case ID/Reference #: [id number]   Effective Oct. 1, 2025, [place] Medicaid coverage for GLP-1s for the treatment of obesity will be discontinued.

## 2024-02-28 ENCOUNTER — Other Ambulatory Visit (HOSPITAL_COMMUNITY): Payer: Self-pay

## 2024-02-28 ENCOUNTER — Telehealth: Payer: Self-pay | Admitting: Radiology

## 2024-02-28 ENCOUNTER — Telehealth: Admitting: Family Medicine

## 2024-02-28 ENCOUNTER — Other Ambulatory Visit: Payer: Self-pay | Admitting: Nurse Practitioner

## 2024-02-28 DIAGNOSIS — N760 Acute vaginitis: Secondary | ICD-10-CM

## 2024-02-28 DIAGNOSIS — B9689 Other specified bacterial agents as the cause of diseases classified elsewhere: Secondary | ICD-10-CM

## 2024-02-28 LAB — NUSWAB VAGINITIS PLUS (VG+)
Atopobium vaginae: HIGH {score} — AB
BVAB 2: HIGH {score} — AB
Candida albicans, NAA: NEGATIVE
Candida glabrata, NAA: NEGATIVE
Chlamydia trachomatis, NAA: NEGATIVE
Megasphaera 1: HIGH {score} — AB
Neisseria gonorrhoeae, NAA: NEGATIVE
Trich vag by NAA: POSITIVE — AB

## 2024-02-28 MED ORDER — METRONIDAZOLE 500 MG PO TABS
500.0000 mg | ORAL_TABLET | Freq: Two times a day (BID) | ORAL | 0 refills | Status: AC
  Filled 2024-02-28: qty 14, 7d supply, fill #0

## 2024-02-28 NOTE — Telephone Encounter (Signed)
 Copied from CRM [id number]. Topic: Clinical - Medication Question >> Feb 28, 2024 12:16 PM Cleave MATSU wrote: Reason for CRM: pt need her medication sent in from lab results

## 2024-02-28 NOTE — Progress Notes (Signed)
 There is no chart for this EV- since your PCP will order the proper medication for your results.

## 2024-02-28 NOTE — Telephone Encounter (Signed)
 Please let patient know that I sent in metronidazole  500mg  tablets. She should take 1 tablet by mouth twice a day for 1 week. This is to treat bacterial vaginosis and trichomonas. Both of which was identified on her vaginal swab. She should avoid alcohol while taking the medication. She should avoid sexual intercourse while on the medication and for 7 days after she completes the medication. She should also discuss with her most recent sexual partners as trichomonas is a STD and they should be tested/treated as well.

## 2024-02-28 NOTE — Progress Notes (Signed)
 treatment

## 2024-03-02 ENCOUNTER — Other Ambulatory Visit (HOSPITAL_COMMUNITY): Payer: Self-pay

## 2024-03-07 ENCOUNTER — Telehealth: Payer: Self-pay

## 2024-03-07 NOTE — Telephone Encounter (Signed)
 Copied from CRM [id number]. Topic: Appointments - Scheduling Inquiry for Clinic >> Mar 07, 2024  4:04 PM Drema MATSU wrote: Reason for CRM: Patient is needing medical clearance for surgery scheduled for 10/13. Offered Oct appointment and patient stated that that is too late. She is needing it sooner.

## 2024-03-10 ENCOUNTER — Telehealth: Payer: Self-pay | Admitting: Radiology

## 2024-03-10 ENCOUNTER — Other Ambulatory Visit: Payer: Self-pay | Admitting: Nurse Practitioner

## 2024-03-10 DIAGNOSIS — B3731 Acute candidiasis of vulva and vagina: Secondary | ICD-10-CM

## 2024-03-10 MED ORDER — FLUCONAZOLE 150 MG PO TABS: 150.0000 mg | ORAL_TABLET | Freq: Once | ORAL | 0 refills | Status: AC

## 2024-03-10 NOTE — Telephone Encounter (Signed)
 Copied from CRM #[id number]. Topic: Clinical - Medical Advice >> Mar 10, 2024  2:05 PM Thersia C wrote: Reason for CRM: Patient stated she believes the medication she was prescribed is making her have a yeast infection stated she is having some  discharge , itchy, would like to know if NP Lauraine Pereyra could send in a prescription for this .

## 2024-03-13 ENCOUNTER — Other Ambulatory Visit: Payer: Self-pay

## 2024-03-13 NOTE — Telephone Encounter (Signed)
 Issue address in different encounter and pt is schedule on 03/17/24

## 2024-03-13 NOTE — Telephone Encounter (Signed)
 Medication for pt issue was send and address in different encounter.

## 2024-03-17 ENCOUNTER — Ambulatory Visit: Admitting: Nurse Practitioner

## 2024-03-17 VITALS — BP 118/62 | HR 72 | Temp 97.8°F | Ht 66.0 in | Wt 191.5 lb

## 2024-03-17 DIAGNOSIS — E669 Obesity, unspecified: Secondary | ICD-10-CM | POA: Diagnosis not present

## 2024-03-17 DIAGNOSIS — J45909 Unspecified asthma, uncomplicated: Secondary | ICD-10-CM | POA: Diagnosis not present

## 2024-03-17 DIAGNOSIS — Z6830 Body mass index (BMI) 30.0-30.9, adult: Secondary | ICD-10-CM

## 2024-03-17 DIAGNOSIS — Z01818 Encounter for other preprocedural examination: Secondary | ICD-10-CM

## 2024-03-17 LAB — URINALYSIS, ROUTINE W REFLEX MICROSCOPIC
Bilirubin Urine: NEGATIVE
Hgb urine dipstick: NEGATIVE
Nitrite: NEGATIVE
Specific Gravity, Urine: 1.025 (ref 1.000–1.030)
Total Protein, Urine: NEGATIVE
Urine Glucose: NEGATIVE
Urobilinogen, UA: 1 (ref 0.0–1.0)
pH: 6 (ref 5.0–8.0)

## 2024-03-17 LAB — LIPID PANEL
Cholesterol: 134 mg/dL (ref 0–200)
HDL: 43.5 mg/dL (ref >39.00)
LDL Cholesterol: 84 mg/dL (ref 0–99)
NonHDL: 90.48
Total CHOL/HDL Ratio: 3
Triglycerides: 32 mg/dL (ref 0.0–149.0)
VLDL: 6.4 mg/dL (ref 0.0–40.0)

## 2024-03-17 LAB — CBC WITH DIFFERENTIAL/PLATELET
Basophils Absolute: 0 K/uL (ref 0.0–0.1)
Basophils Relative: 0.2 % (ref 0.0–3.0)
Eosinophils Absolute: 0.2 K/uL (ref 0.0–0.7)
Eosinophils Relative: 3.8 % (ref 0.0–5.0)
HCT: 33.4 % — ABNORMAL LOW (ref 36.0–46.0)
Hemoglobin: 11.1 g/dL — ABNORMAL LOW (ref 12.0–15.0)
Lymphocytes Relative: 31.1 % (ref 12.0–46.0)
Lymphs Abs: 2 K/uL (ref 0.7–4.0)
MCHC: 33.2 g/dL (ref 30.0–36.0)
MCV: 91.9 fl (ref 78.0–100.0)
Monocytes Absolute: 0.5 K/uL (ref 0.1–1.0)
Monocytes Relative: 7.8 % (ref 3.0–12.0)
Neutro Abs: 3.7 K/uL (ref 1.4–7.7)
Neutrophils Relative %: 57.1 % (ref 43.0–77.0)
Platelets: 241 K/uL (ref 150.0–400.0)
RBC: 3.63 Mil/uL — ABNORMAL LOW (ref 3.87–5.11)
RDW: 14.4 % (ref 11.5–15.5)
WBC: 6.5 K/uL (ref 4.0–10.5)

## 2024-03-17 LAB — HEMOGLOBIN A1C: Hgb A1c MFr Bld: 5.2 % (ref 4.6–6.5)

## 2024-03-17 LAB — PROTIME-INR
INR: 1.1 ratio — ABNORMAL HIGH (ref 0.8–1.0)
Prothrombin Time: 12 s (ref 9.6–13.1)

## 2024-03-17 LAB — COMPREHENSIVE METABOLIC PANEL WITH GFR
ALT: 12 U/L (ref 0–35)
AST: 13 U/L (ref 0–37)
Albumin: 4.1 g/dL (ref 3.5–5.2)
Alkaline Phosphatase: 64 U/L (ref 39–117)
BUN: 8 mg/dL (ref 6–23)
CO2: 27 meq/L (ref 19–32)
Calcium: 8.5 mg/dL (ref 8.4–10.5)
Chloride: 105 meq/L (ref 96–112)
Creatinine, Ser: 0.65 mg/dL (ref 0.40–1.20)
GFR: 120.94 mL/min (ref >60.00)
Glucose, Bld: 55 mg/dL — ABNORMAL LOW (ref 70–99)
Potassium: 3.6 meq/L (ref 3.5–5.1)
Sodium: 138 meq/L (ref 135–145)
Total Bilirubin: 0.2 mg/dL (ref 0.2–1.2)
Total Protein: 7.2 g/dL (ref 6.0–8.3)

## 2024-03-17 LAB — HCG, QUANTITATIVE, PREGNANCY: Quantitative HCG: <0.6 m[IU]/mL

## 2024-03-17 LAB — APTT: aPTT: 34.9 s (ref 25.4–36.8)

## 2024-03-17 LAB — TSH: TSH: 0.8 u[IU]/mL (ref 0.35–5.50)

## 2024-03-17 LAB — T4, FREE: Free T4: 0.78 ng/dL (ref 0.60–1.60)

## 2024-03-17 LAB — T3, FREE: T3, Free: 3.4 pg/mL (ref 2.3–4.2)

## 2024-03-17 NOTE — Progress Notes (Addendum)
 Established Patient Office Visit  Subjective   Patient ID: Amber Lyons, female    DOB: 05/18/1996  Age: 28 y.o. MRN: [id number]  Chief Complaint  Patient presents with   Pre-op Exam    Discussed the use of AI scribe software for clinical note transcription with the patient, who gave verbal consent to proceed.  History of Present Illness Amber Lyons is a 28 year old female who presents for presurgical medical clearance for liposuction 360 with fat transfer to buttocks  Preoperative evaluation - Presenting for presurgical medical clearance for gluteal augmentation - Duke activity status index score: 58.2 - BMI: 30.91 - No recent fevers, chills, or unintentional weight loss - No chest pain, palpitations, difficulty breathing, or leg swelling - No easy bruising or bleeding tendencies - No gastrointestinal or urinary symptoms  Respiratory symptoms - Asthma managed with as-needed albuterol  - No current difficulty breathing - No recent respiratory symptoms - Family history of asthma  Dermatologic symptoms - Eczema, managed with current regimen  Musculoskeletal symptoms - 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 Negative for chills and fever.  Respiratory:  Negative for cough, shortness of breath and wheezing.   Cardiovascular:  Negative for chest pain, palpitations and leg swelling.  Gastrointestinal:  Negative for abdominal pain, blood in stool, nausea and vomiting.  Genitourinary:  Negative for dysuria and hematuria.  Musculoskeletal:  Negative for joint pain and neck pain. Back pain: related to work - [employer]. Skin:  Negative for rash.      Objective:     BP 118/62   Pulse 72   Temp 97.8 F (36.6 C) (Temporal)   Ht 5' 6  (1.676 m)   Wt 191 lb 8 oz (86.9 kg)   LMP 02/27/2024   SpO2 96%   BMI 30.91 kg/m  BP Readings from Last 3 Encounters:  03/17/24 118/62  02/24/24 122/74  09/17/23 100/66   Wt Readings from Last 3 Encounters:  03/17/24 191 lb 8 oz (86.9 kg)  02/24/24 194 lb 6 oz (88.2 kg)  09/17/23 220 lb 4 oz (99.9 kg)      Physical Exam Vitals reviewed.  Constitutional:      General: She is not in acute distress.    Appearance: Normal appearance.  HENT:     Head: Normocephalic and atraumatic.  Neck:     Vascular: No carotid bruit.  Cardiovascular:     Rate and Rhythm: Normal rate and regular rhythm.     Pulses: Normal pulses.     Heart sounds: Normal heart sounds.  Pulmonary:     Effort: Pulmonary effort is normal.     Breath sounds: Normal breath sounds.  Skin:    General: Skin is warm and dry.  Neurological:     General: No focal deficit present.     Mental Status: She is alert and oriented to person, place, and time.  Psychiatric:        Mood and Affect: Mood normal.        Behavior: Behavior normal.        Judgment: Judgment normal.    EKG:NSR  No results found for any visits on 03/17/24.    The ASCVD Risk score (Arnett DK, et al., 2019) failed to calculate for the following  reasons:   The 2019 ASCVD risk score is only valid for ages 20 to 48    Assessment & Plan:   Problem List Items Addressed This Visit   None Visit Diagnoses       Encounter for preoperative examination for general surgical procedure    -  Primary   Relevant Orders   EKG 12-Lead   CBC with Differential/Platelet   Comprehensive metabolic panel with GFR   TSH   Lipid panel   T3, free   T4, free   HIV antibody (with reflex)   Phenytoin level, free and total   Protime-INR   PTT   Hgb Fractionation Cascade   Urinalysis, Routine w reflex microscopic   Urine Culture   Hemoglobin A1c   hCG, serum, qualitative   hCG, quantitative, pregnancy      Assessment and Plan Assessment &  Plan Preoperative medical evaluation for liposuction 360 with fat transfer to buttocks Low to average risk for serious complications based on revised cardiac risk index and Duke activity status index. Normal sinus rhythm on EKG. No anesthesia or surgical history. Noncontributory family history except asthma. Penicillin allergy. BMI 30.91 indicates obesity. - Order urinalysis, hemoglobin A1c, CBC with differential and platelet count, CMP, pregnancy test (qualitative and quantitative), HIV testing, PT, PTT, INR, TSH, T3 free, T4 free, sickle cell test. - Complete evaluation for surgical clearance once lab results are available. - Send surgical clearance paperwork to surgeon's office.   UPDATE 03/23/24: labs received and reviewed. Patient continues to be a low-risk surgical candidate. Paperwork to be faxed to surgeon today.   Asthma Well-controlled with as-needed albuterol . No recent exacerbations or symptoms.  Obesity BMI 30.91. Intentional weight loss noted.   Return in about 6 months (around 09/15/2024), or as scheduled, for F/U with Lauraine.    Lauraine FORBES Pereyra, NP

## 2024-03-18 LAB — URINE CULTURE: Result:: NO GROWTH

## 2024-03-20 LAB — HGB FRACTIONATION CASCADE
Hgb A2: 2.2 % (ref 1.8–3.2)
Hgb A: 97.8 % (ref 96.4–98.8)
Hgb F: 0 % (ref 0.0–2.0)
Hgb S: 0 %

## 2024-03-20 LAB — HIV ANTIBODY (ROUTINE TESTING W REFLEX)
HIV 1&2 Ab, 4th Generation: NONREACTIVE
HIV FINAL INTERPRETATION: NEGATIVE

## 2024-03-20 LAB — PHENYTOIN LEVEL, FREE AND TOTAL: PHENYTOIN, FREE: <0.5 mg/L — ABNORMAL LOW (ref 1.0–2.0)

## 2024-03-20 NOTE — Telephone Encounter (Signed)
 Forwarding message below. Please advise on request; CRM was sent to the wrong pool.

## 2024-03-20 NOTE — Telephone Encounter (Signed)
 Copied from CRM [id number]. Topic: General - Other >> Mar 20, 2024 12:42 PM Taleah C wrote: Reason for CRM: pt called because she wants to know if she was cleared for her surgery or not after her appt with Sarah on 10/3. She also needs the labs faxed to [facility], Fax number [phone]. Please call patient and advise with an update at [phone]

## 2024-03-21 ENCOUNTER — Ambulatory Visit
Admission: EM | Admit: 2024-03-21 | Discharge: 2024-03-21 | Disposition: A | Discharge status: Home / Self Care | Attending: Family Medicine | Admitting: Family Medicine

## 2024-03-21 ENCOUNTER — Telehealth: Payer: Self-pay

## 2024-03-21 DIAGNOSIS — N3001 Acute cystitis with hematuria: Secondary | ICD-10-CM | POA: Diagnosis not present

## 2024-03-21 DIAGNOSIS — R350 Frequency of micturition: Secondary | ICD-10-CM | POA: Diagnosis not present

## 2024-03-21 LAB — POCT URINE DIPSTICK
Bilirubin, UA: NEGATIVE
Glucose, UA: NEGATIVE mg/dL
Nitrite, UA: NEGATIVE
POC PROTEIN,UA: NEGATIVE
Spec Grav, UA: 1.02 (ref 1.010–1.025)
Urobilinogen, UA: 1 U/dL
pH, UA: 8 (ref 5.0–8.0)

## 2024-03-21 LAB — HCG, SERUM, QUALITATIVE: Preg, Serum: NEGATIVE

## 2024-03-21 MED ORDER — CIPROFLOXACIN HCL 500 MG PO TABS: 500.0000 mg | ORAL_TABLET | Freq: Two times a day (BID) | ORAL | 0 refills | Status: AC

## 2024-03-21 MED ORDER — FLUCONAZOLE 150 MG PO TABS: 150.0000 mg | ORAL_TABLET | ORAL | 0 refills | Status: AC

## 2024-03-21 NOTE — ED Provider Notes (Signed)
 [facility]  Note:  This document was prepared using Conservation officer, historic buildings and may include unintentional dictation errors.  MRN: [id number] DOB: 19-Feb-1996  Subjective:   DEIJAH SPIKES is a 28 y.o. female presenting for 1 week history of urinary frequency, urgency, low back pain, malodorous urine. Denies fever, n/v, abdominal pain, pelvic pain, rashes, dysuria, hematuria, vaginal discharge. Has not been drinking water very well. Drinks a lot of urinary irritants, sodas, sweet tea, electrolyte drinks. Gets yeast infections from UTIs.   No current facility-administered medications for this encounter.  Current Outpatient Medications:    albuterol  (PROVENTIL ) (2.5 MG/3ML) 0.083% nebulizer solution, Take 3 mLs (2.5 mg total) by nebulization every 6 (six) hours as needed for wheezing or shortness of breath., Disp: 150 mL, Rfl: 1   albuterol  (VENTOLIN  HFA) 108 (90 Base) MCG/ACT inhaler, Inhale 1-2 puffs into the lungs every 6 (six) hours as needed for wheezing or shortness of breath., Disp: 18 each, Rfl: 2   triamcinolone  cream (KENALOG ) 0.1 %, Apply 1 Application topically 2 (two) times daily., Disp: 454 g, Rfl: 1   Allergies  Allergen Reactions   Penicillins Hives and Swelling    Past Medical History:  Diagnosis Date   Asthma    Eczema      Past Surgical History:  Procedure Laterality Date   NO PAST SURGERIES      Family History  Problem Relation Age of Onset   Asthma Mother    Asthma Father     Social History   Tobacco Use   Smoking status: Former    Current packs/day: 2.00    Types: Cigars, Cigarettes   Smokeless tobacco: Never  Vaping Use   Vaping status: Never Used  Substance Use Topics   Alcohol use: Yes    Comment: social   Drug use: Yes    Types: Marijuana    Comment: last use marijuana was 05-15-2017 at 7 pm.    ROS   Objective:   Vitals: BP 118/76 (BP Location: Right Arm)   Pulse 91   Temp 97.6 F (36.4 C) (Oral)    Resp 16   LMP 02/27/2024 (Exact Date)   SpO2 98%   Physical Exam Constitutional:      General: She is not in acute distress.    Appearance: Normal appearance. She is well-developed. She is not ill-appearing, toxic-appearing or diaphoretic.  HENT:     Head: Normocephalic and atraumatic.     Nose: Nose normal.     Mouth/Throat:     Mouth: Mucous membranes are moist.  Eyes:     General: No scleral icterus.       Right eye: No discharge.        Left eye: No discharge.     Extraocular Movements: Extraocular movements intact.     Conjunctiva/sclera: Conjunctivae normal.  Cardiovascular:     Rate and Rhythm: Normal rate.  Pulmonary:     Effort: Pulmonary effort is normal.  Abdominal:     General: Bowel sounds are normal. There is no distension.     Palpations: Abdomen is soft. There is no mass.     Tenderness: There is no abdominal tenderness. There is no right CVA tenderness, left CVA tenderness, guarding or rebound.  Skin:    General: Skin is warm and dry.  Neurological:     General: No focal deficit present.     Mental Status: She is alert and oriented to person, place, and time.  Psychiatric:  Mood and Affect: Mood normal.        Behavior: Behavior normal.        Thought Content: Thought content normal.        Judgment: Judgment normal.     Results for orders placed or performed during the hospital encounter of 03/21/24 (from the past 24 hours)  POCT URINE DIPSTICK     Status: Abnormal   Collection Time: 03/21/24  5:47 PM  Result Value Ref Range   Color, UA yellow yellow   Clarity, UA hazy (A) clear   Glucose, UA negative negative mg/dL   Bilirubin, UA negative negative   Ketones, POC UA trace (5) (A) negative mg/dL   Spec Grav, UA 8.979 8.989 - 1.025   Blood, UA trace-intact (A) negative   pH, UA 8.0 5.0 - 8.0   POC PROTEIN,UA negative negative, trace   Urobilinogen, UA 1.0 0.2 or 1.0 E.U./dL   Nitrite, UA Negative Negative   Leukocytes, UA Small (1+) (A)  Negative    Assessment and Plan :   PDMP not reviewed this encounter.  1. Acute cystitis with hematuria   2. Urinary frequency     Start ciprofloxacin to cover for acute cystitis, urine culture pending.  Recommended consistent hydration, limiting urinary irritants. Use fluconazole  for antibiotic associated yeast infection. Counseled patient on potential for adverse effects with medications prescribed/recommended today, ER and return-to-clinic precautions discussed, patient verbalized understanding.    Christopher Savannah, [place] 03/21/24 1759

## 2024-03-21 NOTE — ED Triage Notes (Signed)
 Pt reports low back pain, increase urinary frequency ad bad smell in urine x 1 week. Pt has not taken nay meds for complaint.'

## 2024-03-21 NOTE — Discharge Instructions (Signed)
 Please start ciprofloxacin  to address an urinary tract infection. Make sure you hydrate very well with plain water and a quantity of 80 ounces of water a day.  Please limit drinks that are considered urinary irritants such as fruit juices, soda, sweet tea, coffee, artifical sweetened drinks, energy drinks, alcohol.  These can worsen your urinary and genital symptoms but also be the source of them.  I will let you know about your urine culture results through MyChart to see if we need to prescribe or change your antibiotics based off of those results.

## 2024-03-21 NOTE — Telephone Encounter (Signed)
 Copied from CRM [id number]. Topic: General - Other >> Mar 20, 2024 12:42 PM Taleah C wrote: Reason for CRM: pt called because she wants to know if she was cleared for her surgery or not after her appt with Sarah on 10/3. She also needs the labs faxed to [facility], Fax number [phone]. Please call patient and advise with an update at [phone] >> Mar 21, 2024  3:32 PM Robinson H wrote: Patient returned call to office to check status of clearance paperwork, advised patient of message from provider and patient acknowledged stating she forgot provider was out of office. States surgery is in 6 days

## 2024-03-22 ENCOUNTER — Telehealth: Payer: Self-pay

## 2024-03-22 NOTE — Telephone Encounter (Signed)
 Copied from CRM #[id number]. Topic: Clinical - Lab/Test Results >> Mar 22, 2024  4:54 PM Alfonso ORN wrote: Reason for CRM: Pt called back to confirm that lab results as well as Progress Notes were sent to requested facility as they are also needing progress notes for the surgery. Please confirm

## 2024-03-23 LAB — URINE CULTURE: Culture: 20000 — AB

## 2024-03-24 ENCOUNTER — Ambulatory Visit (HOSPITAL_COMMUNITY): Payer: Self-pay

## 2024-03-24 ENCOUNTER — Telehealth: Payer: Self-pay

## 2024-03-24 NOTE — Telephone Encounter (Signed)
 Called pt and made her aware that everything was fax, but will refax the EKG. Also made her aware that her clearance did not stated she needed and x-ray done, but it can be done on Monday, pt stated that she will be leaving this Sunday for it and also explained to her the only way is to referral her to hematology for the iron transfusion, pt stated she will not be able to make it, made pt aware the only to get this done before she leaves on Sunday is to go to the ED to get this done.

## 2024-03-24 NOTE — Telephone Encounter (Signed)
 Copied from CRM [id number]. Topic: General - Other >> Mar 24, 2024  3:36 PM Burnard DEL wrote: Reason for CRM: Patient called in stating that she had her pre operative visit with provider ,however the surgery center that she will be going to told her that she is missing chest xray,EKG,and she needs to repeat her urine,PT and INR and CBC. They said that her hemoglobin is low and it has to be 11.3 and hers is 11.1,and they advised her to have an iron infusion. She is scheduled to leave on Sunday to have procedure done on Monday.

## 2024-03-26 DIAGNOSIS — Z419 Encounter for procedure for purposes other than remedying health state, unspecified: Secondary | ICD-10-CM | POA: Diagnosis not present

## 2024-03-27 ENCOUNTER — Other Ambulatory Visit (HOSPITAL_COMMUNITY): Payer: Self-pay

## 2024-03-27 ENCOUNTER — Other Ambulatory Visit (HOSPITAL_BASED_OUTPATIENT_CLINIC_OR_DEPARTMENT_OTHER): Payer: Self-pay

## 2024-03-27 NOTE — Telephone Encounter (Signed)
 Called pt and made her aware that forms has been fax again and will be sending her my chat message in regards to surgical clearance

## 2024-03-27 NOTE — Telephone Encounter (Unsigned)
 Copied from CRM [id number]. Topic: General - Inquiry >> Mar 27, 2024  8:27 AM Laymon HERO wrote: Reason for CRM: patient asking for Lear Ronnald SQUIBB, CMA to call her, questions on medical forms

## 2024-07-07 ENCOUNTER — Other Ambulatory Visit (HOSPITAL_COMMUNITY): Payer: Self-pay

## 2024-07-07 ENCOUNTER — Other Ambulatory Visit: Payer: Self-pay | Admitting: Family

## 2024-07-07 DIAGNOSIS — L2084 Intrinsic (allergic) eczema: Secondary | ICD-10-CM

## 2024-07-07 MED ORDER — TRIAMCINOLONE ACETONIDE 0.1 % EX CREA
1.0000 | TOPICAL_CREAM | Freq: Two times a day (BID) | CUTANEOUS | 1 refills | Status: AC
  Filled 2024-07-07: qty 454, 34d supply, fill #0

## 2024-07-19 ENCOUNTER — Other Ambulatory Visit (HOSPITAL_COMMUNITY): Payer: Self-pay

## 2024-07-21 ENCOUNTER — Other Ambulatory Visit: Payer: Self-pay | Admitting: Nurse Practitioner

## 2024-07-21 DIAGNOSIS — Z0100 Encounter for examination of eyes and vision without abnormal findings: Secondary | ICD-10-CM

## 2024-08-31 ENCOUNTER — Encounter: Admitting: Nurse Practitioner
